# Patient Record
Sex: Male | Born: 1935 | Race: White | Hispanic: No | Marital: Married | Smoking: Never smoker
Health system: Southern US, Community
[De-identification: ages and names within clinical notes are randomized; demographics above are authoritative.]

## PROBLEM LIST (undated history)

## (undated) DIAGNOSIS — I10 Essential (primary) hypertension: Secondary | ICD-10-CM

## (undated) DIAGNOSIS — M810 Age-related osteoporosis without current pathological fracture: Secondary | ICD-10-CM

## (undated) DIAGNOSIS — E119 Type 2 diabetes mellitus without complications: Secondary | ICD-10-CM

## (undated) DIAGNOSIS — C61 Malignant neoplasm of prostate: Secondary | ICD-10-CM

## (undated) DIAGNOSIS — E785 Hyperlipidemia, unspecified: Secondary | ICD-10-CM

## (undated) DIAGNOSIS — C679 Malignant neoplasm of bladder, unspecified: Secondary | ICD-10-CM

## (undated) DIAGNOSIS — J189 Pneumonia, unspecified organism: Secondary | ICD-10-CM

## (undated) DIAGNOSIS — F0390 Unspecified dementia without behavioral disturbance: Secondary | ICD-10-CM

## (undated) HISTORY — DX: Essential (primary) hypertension: I10

## (undated) HISTORY — PX: ROTATOR CUFF REPAIR: SHX139

## (undated) HISTORY — DX: Type 2 diabetes mellitus without complications: E11.9

## (undated) HISTORY — PX: TONSILLECTOMY: SUR1361

## (undated) HISTORY — DX: Hyperlipidemia, unspecified: E78.5

## (undated) HISTORY — PX: PROSTATE SURGERY: SHX751

---

## 2003-08-10 ENCOUNTER — Ambulatory Visit (HOSPITAL_COMMUNITY): Admission: RE | Admit: 2003-08-10 | Discharge: 2003-08-10 | Payer: Self-pay | Admitting: Gastroenterology

## 2005-09-04 ENCOUNTER — Encounter (INDEPENDENT_AMBULATORY_CARE_PROVIDER_SITE_OTHER): Payer: Self-pay | Admitting: *Deleted

## 2005-09-04 ENCOUNTER — Inpatient Hospital Stay (HOSPITAL_COMMUNITY): Admission: RE | Admit: 2005-09-04 | Discharge: 2005-09-05 | Payer: Self-pay | Admitting: Urology

## 2005-09-06 ENCOUNTER — Emergency Department (HOSPITAL_COMMUNITY): Admission: EM | Admit: 2005-09-06 | Discharge: 2005-09-06 | Payer: Self-pay | Admitting: Emergency Medicine

## 2008-05-14 ENCOUNTER — Encounter: Admission: RE | Admit: 2008-05-14 | Discharge: 2008-05-14 | Payer: Self-pay | Admitting: Urology

## 2008-05-19 ENCOUNTER — Encounter (INDEPENDENT_AMBULATORY_CARE_PROVIDER_SITE_OTHER): Payer: Self-pay | Admitting: Urology

## 2008-05-19 ENCOUNTER — Ambulatory Visit (HOSPITAL_BASED_OUTPATIENT_CLINIC_OR_DEPARTMENT_OTHER): Admission: RE | Admit: 2008-05-19 | Discharge: 2008-05-19 | Payer: Self-pay | Admitting: Urology

## 2008-06-15 ENCOUNTER — Ambulatory Visit (HOSPITAL_COMMUNITY): Admission: RE | Admit: 2008-06-15 | Discharge: 2008-06-15 | Payer: Self-pay | Admitting: Urology

## 2008-06-15 ENCOUNTER — Encounter (INDEPENDENT_AMBULATORY_CARE_PROVIDER_SITE_OTHER): Payer: Self-pay | Admitting: Urology

## 2008-12-29 ENCOUNTER — Ambulatory Visit (HOSPITAL_COMMUNITY): Admission: RE | Admit: 2008-12-29 | Discharge: 2008-12-29 | Payer: Self-pay | Admitting: Gastroenterology

## 2010-05-28 ENCOUNTER — Encounter: Payer: Self-pay | Admitting: Urology

## 2010-08-13 LAB — GLUCOSE, CAPILLARY

## 2010-08-22 LAB — BASIC METABOLIC PANEL
CO2: 32 mEq/L (ref 19–32)
Calcium: 9.2 mg/dL (ref 8.4–10.5)
Chloride: 100 mEq/L (ref 96–112)
Creatinine, Ser: 1.02 mg/dL (ref 0.4–1.5)
GFR calc Af Amer: >60 mL/min (ref >60)
Glucose, Bld: 227 mg/dL — ABNORMAL HIGH (ref 70–99)

## 2010-08-22 LAB — GLUCOSE, CAPILLARY: Glucose-Capillary: 155 mg/dL — ABNORMAL HIGH (ref 70–99)

## 2010-08-22 LAB — POCT I-STAT 4, (NA,K, GLUC, HGB,HCT)
Glucose, Bld: 169 mg/dL — ABNORMAL HIGH (ref 70–99)
Hemoglobin: 14.3 g/dL (ref 13.0–17.0)
Potassium: 4.2 mEq/L (ref 3.5–5.1)

## 2010-08-23 LAB — BASIC METABOLIC PANEL
BUN: 18 mg/dL (ref 6–23)
Creatinine, Ser: 0.96 mg/dL (ref 0.4–1.5)
GFR calc Af Amer: >60 mL/min (ref >60)
GFR calc non Af Amer: >60 mL/min (ref >60)
Potassium: 5.6 mEq/L — ABNORMAL HIGH (ref 3.5–5.1)

## 2010-08-23 LAB — HEMOGLOBIN AND HEMATOCRIT, BLOOD
HCT: 37.5 % — ABNORMAL LOW (ref 39.0–52.0)
Hemoglobin: 12.4 g/dL — ABNORMAL LOW (ref 13.0–17.0)

## 2010-09-20 NOTE — Op Note (Signed)
NAME:  [name], [name]                ACCOUNT NO.:  [id number]   MEDICAL RECORD NO.:  [id number]          PATIENT TYPE:  AMB   LOCATION:  ENDO                         FACILITY:  [facility]   PHYSICIAN:  Petra Kuba, M.D.    DATE OF BIRTH:  1929/12/09   DATE OF PROCEDURE:  12/29/2008  DATE OF DISCHARGE:                               OPERATIVE REPORT   PROCEDURE:  Colonoscopy   INDICATIONS:  Screening.   Consent was signed after risks, benefits, methods and options thoroughly  discussed multiple times in the past.   MEDICINES USED:  Fentanyl 50 mcg, Versed 4 mg.   PROCEDURE:  Rectal inspection is pertinent for external hemorrhoids,  small.  Digital exam was negative.  The video pediatric colonoscope was  inserted and easily advanced around the colon to the cecum.  Other than  some left greater than rare right diverticula, no abnormalities were  seen.  To advance to the cecum did not require any abdominal pressure or  any position changes.  The cecum was identified by the appendiceal  orifice and ileocecal valve.  The scope was slowly withdrawn.  The prep  was adequate.  There was minimal liquid stool that required washing and  suctioning.  On slow withdrawal through the colon other than the  diverticula mentioned above, no abnormalities were seen, specifically no  polyps, tumors or masses.  Once back in the rectum anorectal pull-  through and retroflexion confirmed some small hemorrhoids.  The scope  was straightened and readvanced a short ways up the left side of the  colon.  Air was suctioned, scope removed.  The patient tolerated the  procedure well.  There was no obvious immediate complication.   ENDOSCOPIC DIAGNOSES:  1. Internal-external small hemorrhoids.  2. Left greater than rare right diverticula.  3. Otherwise within normal limits to the cecum.   PLAN:  Doubt he needs any further colonic screening, but happy to see  back p.r.n.  I will return the care to Dr. Clarene Duke for the  customary  healthcare maintenance.           ______________________________  Petra Kuba, M.D.     MEM/MEDQ  D:  12/29/2008  T:  12/29/2008  Job:  [id number]   cc:   Caryn Bee L. Little, M.D.  Fax: [phone]

## 2010-09-20 NOTE — Op Note (Signed)
NAME:  [name] NO.:  [id number]   MEDICAL RECORD NO.:  [id number]          PATIENT TYPE:  AMB   LOCATION:  NESC                         FACILITY:  [facility]   PHYSICIAN:  Heloise Purpura, MD      DATE OF BIRTH:  [birth year]   DATE OF PROCEDURE:  05/19/2008  DATE OF DISCHARGE:                               OPERATIVE REPORT   PREOPERATIVE DIAGNOSES:  1. Right ureteral calculus.  2. Right ureteral duplication.   POSTOPERATIVE DIAGNOSES:  1. Right ureteral calculus.  2. Right ureteral duplication.  3. Bladder tumor.   PROCEDURES:  1. Cystoscopy.  2. Saline bladder washing.  3. Bladder biopsy x2.  4. Right retrograde pyelography.  5. Right ureteroscopy with laser lithotripsy and stone removal.  6. Right ureteral stent placement.   SURGEON:  Dr. Heloise Purpura.   ANESTHESIA:  General.   COMPLICATIONS:  None.   INDICATIONS:  Mr. Lauder is a 77 year old gentleman with a history of  microscopic hematuria.  He underwent an evaluation in the summer of  2009, which demonstrated bilateral renal calculi.  He did develop acute  right-sided flank pain which subsequently resolved, although he did  develop some gross hematuria.  He underwent an ultrasound which  demonstrated right-sided hydronephrosis with bilateral renal calculi as  seen previously.  Further evaluation with a CT scan demonstrated that he  did have an obstructing 7 mm stone in the distal lower pole ureter on  the right side.  After discussion regarding management options for  treatment, he elected to proceed with ureteroscopic laser lithotripsy.  The potential risks, complications, and alternative treatment options  were discussed in detail.  Informed consent was obtained.   DESCRIPTION OF PROCEDURE:  The patient was taken to the operating room  and a general anesthetic was administered.  He was given preoperative  antibiotics, placed in the dorsal lithotomy position, and prepped and  draped in the  usual sterile fashion.  Next, a preoperative timeout was  performed.  Cystourethroscopy was then performed and the bladder was  systematically examined.  The urethra also appeared normal.  The  prostatic urethra was absent, consistent with the patient's prior  radical prostatectomy.  On inspection of the bladder, the patient was  noted to have a very small and low-grade appearing incidentally detected  papillary tumor just lateral to the right ureteral orifice.  There was  also an area of erythema toward the dome of the bladder.  A saline  bladder washing was obtained and both the patient's tumor and the  erythematous areas were biopsied.  These were then fulgurated with the  Bugbee electrode.  The patient's two right ureters were then  sequentially intubated with a 6-French ureteral catheter and retrograde  pyelography was performed.  The medial ureteral orifice drain in the  upper pole appeared to be normal without evidence of dilation or filling  defects.  The lower pole lateral ureteral orifice did demonstrate an  obvious filling defect in the distal ureter consistent with the  patient's stone.  A 0.038 Sensor guidewire was advanced by the wire up  into the lower pole renal pelvis.  The ureteral orifice was then balloon  dilated and a semi-rigid ureteroscopy was performed.  The stone was  visualized and fragmented with the holmium laser at a setting of 0.6  joules and 6 Hz.  The stone fragmented relatively well and was able to  be then extracted with the nitinol basket.  Once all stone fragments  were adequately removed, it was decided to place the ureteral stent.  Therefore, the wire was back loaded over the cystoscope and a 6 x 24  double-J ureteral stent was advanced over the wire using Seldinger  technique.  It was appropriately positioned under fluoroscopic and  cystoscopic guidance and a string tether was left in place.  The bladder  was then re-inspected and all stone fragments  were removed and sent for  stone analysis.  The patient's bladder was emptied and the procedure was  ended.  Both previously mentioned biopsy sites appeared to be without  evidence of bleeding.  The patient tolerated the procedure well without  complications.  He was able to be awakened and transferred to the  recovery unit in satisfactory condition.      Heloise Purpura, MD  Electronically Signed     LB/MEDQ  D:  05/19/2008  T:  05/19/2008  Job:  [id number]

## 2010-09-20 NOTE — Op Note (Signed)
NAME:  [name], BORMAN NO.:  [id number]   MEDICAL RECORD NO.:  [id number]          PATIENT TYPE:  AMB   LOCATION:  DAY                          FACILITY:  [facility]   PHYSICIAN:  Heloise Purpura, MD      DATE OF BIRTH:  02-13-1930   DATE OF PROCEDURE:  06/15/2008  DATE OF DISCHARGE:                               OPERATIVE REPORT   PREOPERATIVE DIAGNOSES:  1. Hematuria.  2. 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   POSTOPERATIVE DIAGNOSES:  1. Hematuria.  2. 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   PROCEDURE:  1. Cystoscopy.  2. Bladder biopsy.   SURGEON:  Heloise Purpura, M.D.   ANESTHESIA:  General.   COMPLICATIONS:  None.   ESTIMATED BLOOD LOSS:  Minimal.   INDICATIONS:  [name] is a 77 year old gentleman who recently  presented with gross hematuria and underwent an evaluation which  demonstrated a right ureteral calculus.  He underwent ureteroscopic  treatment of his calculus and was noted during that procedure to have a  small papillary bladder tumor as well as an erythematous lesion in the  bladder.  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  The biopsy from the erythematous  area within the bladder demonstrated a very focal area concerning for  possible carcinoma in situ.  After discussion with pathology, it was  elected to perform more biopsies to obtain more tissue in order to  determine whether he definitively had carcinoma in situ.  This was  discussed with the patient and the potential risks, complications, and  alternative options were discussed.  Informed consent was obtained.   DESCRIPTION OF PROCEDURE:  The patient was taken to the operating room  and a general anesthetic was administered.  He was given preoperative  antibiotics, placed in the dorsal lithotomy position, and prepped and  draped in the usual sterile fashion.  Next, a preoperative time-out was  performed.   Cystourethroscopy was then performed with a 12-degree and 70-  degree lens which demonstrated a normal urethra.  The prostate was  absent consistent with his history of a radical prostatectomy.  The  patient does have a history of duplicated ureters and these were noted  on cystoscopy in the expected positions bilaterally.  He did have a  healing site just lateral to the right ureteral orifices from his prior  resection of his papillary lesion.  There was also noted to be a healing  lesion toward the posterior dome of the bladder.  The bladder was  otherwise free of any lesions, tumors, or other mucosal pathology.  The  patient's healing previous biopsy site at the posterior dome was then  carefully examined.  Approximately four cold cup biopsies were taken of  the tissue immediately surrounding this previous biopsy site.  The Bugbee electrode was then used to perform fulguration of the site.  The bladder was emptied and reinspected and hemostasis appeared  excellent.  The procedure was, therefore, ended.  He tolerated the  procedure well without complications.  He was able  to be awakened and  transferred to recovery unit in satisfactory condition.      Heloise Purpura, MD  Electronically Signed     LB/MEDQ  D:  06/15/2008  T:  06/15/2008  Job:  [id number]

## 2010-09-23 NOTE — H&P (Signed)
NAME:  ROURKE, MCQUITTY NO.:  [id number]   MEDICAL RECORD NO.:  [id number]          PATIENT TYPE:  INP   LOCATION:  NA                           FACILITY:  [facility]   PHYSICIAN:  Heloise Purpura, MD      DATE OF BIRTH:  December 14, 1929   DATE OF ADMISSION:  09/04/2005  DATE OF DISCHARGE:                                HISTORY & PHYSICAL   CHIEF COMPLAINT:  Prostate cancer.   HISTORY:  Mr. Brucato is a 77 year old gentleman with recently-diagnosed  clinical stage T1C prostate cancer with a PSA of 4.48 and a Gleason score  3+3=6.  His prostate volume was measured at 40 cc.  After a discussion  regarding management options for clinically localized prostate cancer, the  patient elected to proceed with surgical therapy.  Specifically, due to the  patient's and low-risk disease, a detailed discussion was carried out  regarding active surveillance as a treatment option.  However, due to the  significant negative psychological effects of not actively treating his  malignancy, the patient was adamant about proceeding with therapy of  curative intent.   PAST MEDICAL HISTORY:  1.  Type 2 diabetes.  2.  Urolithiasis.  3.  Hypercholesterolemia.   PAST SURGICAL HISTORY:  Rotator cuff surgery.   MEDICATIONS:  1.  Metformin.  2.  Glipizide.  3.  Lipitor.  4.  Aspirin on hold.  5.  Multivitamin.   ALLERGIES:  No known drug allergies.   FAMILY HISTORY:  The patient's father lived to be 63.  There was a history  of hypertension and cerebral vascular accidents in the family.  There is no  history of prostate cancer.  The patient also has multiple other family  members have lived into their mid 17s.   SOCIAL HISTORY:  The patient is currently retired.  He is married.  He  denies tobacco or alcohol use.   PHYSICAL EXAMINATION:  CONSTITUTIONAL:  The patient is a well-nourished,  well-developed male in no acute distress.  CARDIOVASCULAR:  Regular rate and rhythm without obvious  murmurs.  LUNGS:  Clear bilaterally.  ABDOMEN:  Soft, nontender, nondistended without abdominal masses or bruits.  BACK:  No CVA tenderness.  DIGITAL RECTAL EXAM:  Prostate was approximately 50 g without nodularity or  induration.   IMPRESSION:  Clinically localized adenocarcinoma of prostate.   PLAN:  Mr. Johnsey will undergo a robotic-assisted laparoscopic radical  prostatectomy.  Postoperatively, he will be admitted to the hospital for  routine postoperative care.           ______________________________  Heloise Purpura, MD  Electronically Signed     LB/MEDQ  D:  09/04/2005  T:  09/04/2005  Job:  [id number]

## 2010-09-23 NOTE — Op Note (Signed)
NAME:  [name], BETHEL NO.:  [id number]   MEDICAL RECORD NO.:  [id number]          PATIENT TYPE:  INP   LOCATION:  1416                         [facility]ACILITY:  [facility]   PHYSICIAN:  Heloise Purpura, MD      DATE OF BIRTH:  [birth year]   DATE OF PROCEDURE:  09/04/2005  DATE OF DISCHARGE:                                 OPERATIVE REPORT   PREOPERATIVE DIAGNOSIS:  Clinically localized adenocarcinoma of prostate.   POSTOPERATIVE DIAGNOSIS:  Clinically localized adenocarcinoma of prostate.   PROCEDURE:  Robotic assisted laparoscopic radical prostatectomy (bilateral  nerve sparing).   SURGEON:  Dr. Heloise Purpura.   ASSISTANT:  Dr. Barron Alvine.   ANESTHESIA:  General.   COMPLICATIONS:  None.   ESTIMATED BLOOD LOSS:  50 mL.   INTRAVENOUS FLUIDS:  1200 mL of lactated Ringer's.   SPECIMEN:  Prostate and seminal vesicles.   DRAINS:  1.  A 20-French straight catheter.  2.  A #19 Blake pelvic drain.   INDICATIONS:  [name] is a 77 year old gentleman with clinical stage T1c  prostate cancer with a PSA of 4.48 and a Gleason score of 3+3= 6.  After  discussing management options for clinically localized prostate cancer, the  patient elected to proceed with surgical therapy.  Potential risks and  benefits of the above procedures were discussed with the patient and he  consented.   DESCRIPTION OF PROCEDURE:  The patient was taken to the operating room and a  general anesthetic was administered.  He was given preoperative antibiotics,  placed in the dorsal lithotomy position, prepped and draped in the usual  sterile fashion.  Next, a preoperative time-out was performed.  A Foley  catheter was then inserted to the bladder.  A site was then selected 18 cm  from the pubic symphysis and just to the left of the umbilicus for placement  of the camera port.  This was placed using a standard open Hasson technique.  This allowed entry into the peritoneal cavity under direct vision.   A  pneumoperitoneum was then established after a 12 mm port was placed.  Examination of the abdomen demonstrated no intraabdominal injuries or other  abnormalities.  The remaining ports were then placed.  Bilateral 8 mm  robotic ports were placed 16 cm from the pubic symphysis and 10 cm lateral  to the camera port.  An additional 8 mm robotic port was placed in the far  left lateral abdominal wall.  A 5 mm port was placed between the camera port  and the right robotic port.  An additional 12 mm port was placed in the far  right lateral abdominal wall for laparoscopic assistance.  All ports were  placed under direct vision without difficulty.  The surgical cart was then  docked.  With the aid of the hook cautery, the bladder was reflected  posteriorly allowing entry into the space of Retzius and identification of  the endopelvic fascia and prostate.  The endopelvic fascia was then incised  from the apex back to the base of the prostate allowing the underlying  levator  muscle fibers to be swept laterally off the prostate.  This isolated  the dorsal venous complex.  The dorsal venous complex was then ligated with  figure-of-eight 0 Vicryl suture.  Attention then turned to the bladder neck  which was identified with the help of Foley catheter manipulation.  The  bladder neck was then incised anteriorly, allowing exposure of the Foley  catheter.  The Foley catheter balloon was then deflated allowing the  catheter to be brought into the operative field and used to retract the  prostate anteriorly.  This exposed the posterior bladder neck which was  similarly divided.  Dissection continued posteriorly until the vasa  deferentia and seminal vesicles were identified.  The vasa deferentia were  identified, isolated and divided.  The seminal vesicles were then isolated  and divided after the seminal vesicle arteries were ligated with Hem-o-lok  clips.  This isolated the vascular pedicles of the  prostate.  Attention then  turned to the anterolateral portion of the prostate.  The lateral prostatic  fascia was incised allowing the neurovascular bundles to be separated from  the prostate.  Hem-o-lock clips were then used to ligate the vascular  pedicles of the prostate between the neurovascular bundle and the prostate.  The neurovascular bundles were then swept off the prostate out to the apex.  The urethra was then sharply divided along with the dorsal venous complex  which had been previously ligated.  This allowed the prostate be  disarticulated and placed up into the abdomen.  There were noted to be some  opened dorsal veins which were ligated with a second 0 Vicryl figure-of-  eight suture.  The pelvis was then copiously irrigated and hemostasis was  ensured.  With irrigation in the pelvis, air was injected into the rectal  catheter and there was no evidence of a rectal injury.  Attention then  turned to the urethral anastomosis.  A double-armed 3-0 Monocryl suture was  used to perform a 360 degree running tension-free anastomosis between the  bladder neck and urethra.  A new 20-French Coude catheter was then inserted  into the bladder and irrigated.  There were no blood clots within the  bladder and the catheter appeared to irrigate well.  The anastomosis  appeared to be watertight.  A #19 Blake drain was then brought through the  left robotic port and appropriately positioned in the pelvis.  It was  secured to the skin with a nylon suture.  The Endopouch retrieval bag was  then placed through the periumbilical port site to retrieve the prostate for  later removal from the body.  A 0 Vicryl stitch was placed through the right  side of the 12 mm port site fascia for port site closure.  All remaining  ports were then removed under direct vision.  The prostate specimen was then  removed intact within the Endopouch retrieval bag via the periumbilical port site.  This fascial opening  was then closed with a running 0 Vicryl suture.  Marcaine 0.25% was then injected into all incision sites which were  reapproximate at the skin level with Dermabond.  The patient appeared to  tolerate the procedure well without complications.  He was able to be  extubated and transferred to the recovery unit in satisfactory condition.  All sponge and needle counts were correct x2 at the end of the procedure.           ______________________________  Heloise Purpura, MD  Electronically Signed  LB/MEDQ  D:  09/04/2005  T:  09/04/2005  Job:  [id number]

## 2010-09-23 NOTE — Op Note (Signed)
NAME:  [name], DESHLER                          ACCOUNT NO.:  [id number]   MEDICAL RECORD NO.:  [id number]                   PATIENT TYPE:  AMB   LOCATION:  ENDO                                 FACILITY:  [facility]   PHYSICIAN:  Petra Kuba, M.D.                 DATE OF BIRTH:  15-Dec-1929   DATE OF PROCEDURE:  08/10/2003  DATE OF DISCHARGE:                                 OPERATIVE REPORT   PROCEDURE PERFORMED:  Colonoscopy.   ENDOSCOPIST:  Petra Kuba, M.D.   INDICATIONS FOR PROCEDURE:  Screening.  Consent was signed after the risks,  benefits, methods and options were thoroughly discussed in the office.   MEDICINES USED:  Demerol 50 mg, Versed 4 mg.   DESCRIPTION OF PROCEDURE:  Rectal inspection was pertinent for external  hemorrhoids, small.  Digital exam was negative.  A video pediatric  adjustable colonoscope was inserted and fairly easily advanced around the  colon to the cecum.  This did require rolling him on his back and some  abdominal pressure.  On insertion some left and right scattered diverticula  were seen but no other abnormality.  The cecum was identified by the  appendiceal orifice and the ileocecal valve.  The prep in the cecum and the  ascending was fair.  There was some stool adherent to the walls that could  not be washed off.  The rest of the prep was adequate.  On slow withdrawal  through the colon again, other than the left and right diverticula, no other  abnormalities were seen.  Specifically, no polyps, tumors, masses.  Anorectal pull-through and retroflexion confirmed small hemorrhoids.  Scope  was reinserted a short ways up the left side of the colon, air was  suctioned, scope removed.  The patient tolerated the procedure well.  There  was no immediate obvious complication except for intraprocedure bradycardia  which responded to suctioning air and withdrawing the loop.   ENDOSCOPIC DIAGNOSIS:  1. Internal and external hemorrhoids, small.  2. Left  and right scattered diverticula.  3. Otherwise within normal limits to the cecum.   PLAN:  Happy to see back p.r.n.  Get a repeat screening in five to 10 years.  Otherwise return care to  for the customary health care maintenance to  include yearly rectals and guaiacs.                                               Petra Kuba, M.D.   MEM/MEDQ  D:  08/10/2003  T:  08/10/2003  Job:  [id number]   cc:   Caryn Bee L. Little, M.D.  [address]  [place]  [place] [address]  Fax: [phone]

## 2010-09-23 NOTE — Discharge Summary (Signed)
NAMEJORGELUIS, [name] NO.:  [id number]   MEDICAL RECORD NO.:  [id number]          PATIENT TYPE:  INP   LOCATION:  1416                         FACILITY:  [facility]   PHYSICIAN:  Heloise Purpura, MD      DATE OF BIRTH:  1929-07-17   DATE OF ADMISSION:  09/04/2005  DATE OF DISCHARGE:  09/05/2005                                 DISCHARGE SUMMARY   ADMISSION DIAGNOSIS:  Clinically localized adenocarcinoma of prostate.   DISCHARGE DIAGNOSIS:  Clinically localized adenocarcinoma of prostate.   PROCEDURES:  Robotic-assisted laparoscopic radical prostatectomy.   HISTORY AND PHYSICAL:  For full details, please see admission history and  physical.  Briefly, Mr. [name] is a 77 year old gentleman with a recently  diagnosed clinical stage T1c prostate cancer with a PSA of 4.48 and a  Gleason score of 3 + 3 = 6.  After discussion regarding management options  for clinically localized prostate cancer, the patient elected to proceed  with surgical therapy.   HOSPITAL COURSE:  The patient was admitted the hospital, on September 04, 2005,  and underwent an uneventful robotic-assisted laparoscopic radical  prostatectomy.  Postoperatively, he was able to be transferred to a regular  hospital room following recovery from anesthesia.  Over the course of the  next 24 hours, he was able to begin ambulating which he did without  difficulty.  Furthermore, on postoperative day #1, he was able to begin a  clear liquid diet and then be transitioned to oral pain medication.  On the  morning of postoperative day #1, he did have some bloody drainage around his  [place]att site.  In addition, his hemoglobin had dropped to 10.3 from  12.9 immediately postoperatively.  Therefore, a hemoglobin was rechecked  approximately six hours later and it was stable at 10.2.  He had minimal  output from his pelvic drain and it was therefore removed.  The patient was  monitored throughout the remainder of the  afternoon on postoperative day #1  and continued to be stable without significant drainage from his [name] site.  He maintained excellent urine output and was subsequently able  to be discharged home later that afternoon.   DISPOSITION:  Home.   DISCHARGE MEDICATIONS:  Mr. [name] was instructed to resume his regular home  medications, excepting any aspirin or nonsteroidal anti-inflammatory drugs  or herbal supplements.  He was given Vicodin to take as needed for pain,  Colace as a stool softener, and Cipro to take beginning one day prior to his  return visit for Foley catheter removal.   DISCHARGE INSTRUCTIONS:  1.  Mr. [name] was encouraged to be ambulatory but specifically instructed to      refrain from any heavy lifting, strenuous activity, or driving.  2.  He was instructed to gradually advance his diet once passing flatus.  3.  In addition, he was told to call should he have signs or symptoms of      wound infection which were explained to him, fever, or difficulty with      his Foley catheter.  4.  He  was given instructions on Foley catheter care and given a leg bag for      daytime usage.   FINAL PATHOLOGY:  This demonstrated a Gleason 3 + 4 = 7, T2c NX MX  adenocarcinoma of the prostate indicating organ-confined disease.   FOLLOWUP:  Mr. [name] will return to see me approximately one week for Foley  catheter removal and to further discuss this pathology in detail.           ______________________________  Heloise Purpura, MD  Electronically Signed     LB/MEDQ  D:  09/06/2005  T:  09/06/2005  Job:  [id number]   cc:   Caryn Bee L. Little, M.D.  Fax: [phone]

## 2011-05-15 DIAGNOSIS — L570 Actinic keratosis: Secondary | ICD-10-CM | POA: Diagnosis not present

## 2011-05-15 DIAGNOSIS — L821 Other seborrheic keratosis: Secondary | ICD-10-CM | POA: Diagnosis not present

## 2011-05-15 DIAGNOSIS — C44211 Basal cell carcinoma of skin of unspecified ear and external auricular canal: Secondary | ICD-10-CM | POA: Diagnosis not present

## 2011-05-15 DIAGNOSIS — C44319 Basal cell carcinoma of skin of other parts of face: Secondary | ICD-10-CM | POA: Diagnosis not present

## 2011-05-15 DIAGNOSIS — Z85820 Personal history of malignant melanoma of skin: Secondary | ICD-10-CM | POA: Diagnosis not present

## 2011-06-09 DIAGNOSIS — R82998 Other abnormal findings in urine: Secondary | ICD-10-CM | POA: Diagnosis not present

## 2011-06-09 DIAGNOSIS — Z8546 Personal history of malignant neoplasm of prostate: Secondary | ICD-10-CM | POA: Diagnosis not present

## 2011-06-09 DIAGNOSIS — D090 Carcinoma in situ of bladder: Secondary | ICD-10-CM | POA: Diagnosis not present

## 2011-06-09 DIAGNOSIS — Z8551 Personal history of malignant neoplasm of bladder: Secondary | ICD-10-CM | POA: Diagnosis not present

## 2011-06-26 DIAGNOSIS — L570 Actinic keratosis: Secondary | ICD-10-CM | POA: Diagnosis not present

## 2011-06-26 DIAGNOSIS — Z85828 Personal history of other malignant neoplasm of skin: Secondary | ICD-10-CM | POA: Diagnosis not present

## 2011-07-26 DIAGNOSIS — Z111 Encounter for screening for respiratory tuberculosis: Secondary | ICD-10-CM | POA: Diagnosis not present

## 2011-08-01 DIAGNOSIS — L570 Actinic keratosis: Secondary | ICD-10-CM | POA: Diagnosis not present

## 2011-08-01 DIAGNOSIS — Z0000 Encounter for general adult medical examination without abnormal findings: Secondary | ICD-10-CM | POA: Diagnosis not present

## 2011-08-01 DIAGNOSIS — E119 Type 2 diabetes mellitus without complications: Secondary | ICD-10-CM | POA: Diagnosis not present

## 2011-08-01 DIAGNOSIS — Z23 Encounter for immunization: Secondary | ICD-10-CM | POA: Diagnosis not present

## 2011-08-01 DIAGNOSIS — Z8546 Personal history of malignant neoplasm of prostate: Secondary | ICD-10-CM | POA: Diagnosis not present

## 2011-08-01 DIAGNOSIS — E1142 Type 2 diabetes mellitus with diabetic polyneuropathy: Secondary | ICD-10-CM | POA: Diagnosis not present

## 2011-08-01 DIAGNOSIS — C679 Malignant neoplasm of bladder, unspecified: Secondary | ICD-10-CM | POA: Diagnosis not present

## 2011-08-01 DIAGNOSIS — E1149 Type 2 diabetes mellitus with other diabetic neurological complication: Secondary | ICD-10-CM | POA: Diagnosis not present

## 2011-08-01 DIAGNOSIS — Z79899 Other long term (current) drug therapy: Secondary | ICD-10-CM | POA: Diagnosis not present

## 2011-08-01 DIAGNOSIS — C61 Malignant neoplasm of prostate: Secondary | ICD-10-CM | POA: Diagnosis not present

## 2011-08-01 DIAGNOSIS — Z8551 Personal history of malignant neoplasm of bladder: Secondary | ICD-10-CM | POA: Diagnosis not present

## 2011-08-01 DIAGNOSIS — E7800 Pure hypercholesterolemia, unspecified: Secondary | ICD-10-CM | POA: Diagnosis not present

## 2011-08-15 DIAGNOSIS — Z13820 Encounter for screening for osteoporosis: Secondary | ICD-10-CM | POA: Diagnosis not present

## 2011-09-18 DIAGNOSIS — D090 Carcinoma in situ of bladder: Secondary | ICD-10-CM | POA: Diagnosis not present

## 2011-09-18 DIAGNOSIS — Z8551 Personal history of malignant neoplasm of bladder: Secondary | ICD-10-CM | POA: Diagnosis not present

## 2011-09-18 DIAGNOSIS — N200 Calculus of kidney: Secondary | ICD-10-CM | POA: Diagnosis not present

## 2011-09-18 DIAGNOSIS — Z8546 Personal history of malignant neoplasm of prostate: Secondary | ICD-10-CM | POA: Diagnosis not present

## 2011-09-20 DIAGNOSIS — D090 Carcinoma in situ of bladder: Secondary | ICD-10-CM | POA: Diagnosis not present

## 2011-09-20 DIAGNOSIS — C61 Malignant neoplasm of prostate: Secondary | ICD-10-CM | POA: Diagnosis not present

## 2011-11-02 DIAGNOSIS — E1149 Type 2 diabetes mellitus with other diabetic neurological complication: Secondary | ICD-10-CM | POA: Diagnosis not present

## 2011-11-02 DIAGNOSIS — R609 Edema, unspecified: Secondary | ICD-10-CM | POA: Diagnosis not present

## 2011-11-02 DIAGNOSIS — E1142 Type 2 diabetes mellitus with diabetic polyneuropathy: Secondary | ICD-10-CM | POA: Diagnosis not present

## 2011-11-02 DIAGNOSIS — R635 Abnormal weight gain: Secondary | ICD-10-CM | POA: Diagnosis not present

## 2011-11-13 DIAGNOSIS — L570 Actinic keratosis: Secondary | ICD-10-CM | POA: Diagnosis not present

## 2011-11-13 DIAGNOSIS — Z85820 Personal history of malignant melanoma of skin: Secondary | ICD-10-CM | POA: Diagnosis not present

## 2011-12-15 DIAGNOSIS — C61 Malignant neoplasm of prostate: Secondary | ICD-10-CM | POA: Diagnosis not present

## 2011-12-22 DIAGNOSIS — C61 Malignant neoplasm of prostate: Secondary | ICD-10-CM | POA: Diagnosis not present

## 2011-12-22 DIAGNOSIS — D090 Carcinoma in situ of bladder: Secondary | ICD-10-CM | POA: Diagnosis not present

## 2011-12-22 DIAGNOSIS — R82998 Other abnormal findings in urine: Secondary | ICD-10-CM | POA: Diagnosis not present

## 2012-01-30 DIAGNOSIS — E1149 Type 2 diabetes mellitus with other diabetic neurological complication: Secondary | ICD-10-CM | POA: Diagnosis not present

## 2012-01-30 DIAGNOSIS — R609 Edema, unspecified: Secondary | ICD-10-CM | POA: Diagnosis not present

## 2012-01-31 DIAGNOSIS — Z23 Encounter for immunization: Secondary | ICD-10-CM | POA: Diagnosis not present

## 2012-01-31 DIAGNOSIS — E1149 Type 2 diabetes mellitus with other diabetic neurological complication: Secondary | ICD-10-CM | POA: Diagnosis not present

## 2012-01-31 DIAGNOSIS — E1142 Type 2 diabetes mellitus with diabetic polyneuropathy: Secondary | ICD-10-CM | POA: Diagnosis not present

## 2012-01-31 DIAGNOSIS — R609 Edema, unspecified: Secondary | ICD-10-CM | POA: Diagnosis not present

## 2012-03-26 DIAGNOSIS — E11329 Type 2 diabetes mellitus with mild nonproliferative diabetic retinopathy without macular edema: Secondary | ICD-10-CM | POA: Diagnosis not present

## 2012-03-26 DIAGNOSIS — H35359 Cystoid macular degeneration, unspecified eye: Secondary | ICD-10-CM | POA: Diagnosis not present

## 2012-03-26 DIAGNOSIS — H35379 Puckering of macula, unspecified eye: Secondary | ICD-10-CM | POA: Diagnosis not present

## 2012-03-26 DIAGNOSIS — H2510 Age-related nuclear cataract, unspecified eye: Secondary | ICD-10-CM | POA: Diagnosis not present

## 2012-04-15 DIAGNOSIS — L570 Actinic keratosis: Secondary | ICD-10-CM | POA: Diagnosis not present

## 2012-04-15 DIAGNOSIS — L821 Other seborrheic keratosis: Secondary | ICD-10-CM | POA: Diagnosis not present

## 2012-04-15 DIAGNOSIS — D235 Other benign neoplasm of skin of trunk: Secondary | ICD-10-CM | POA: Diagnosis not present

## 2012-04-15 DIAGNOSIS — Z85820 Personal history of malignant melanoma of skin: Secondary | ICD-10-CM | POA: Diagnosis not present

## 2012-04-25 DIAGNOSIS — Z961 Presence of intraocular lens: Secondary | ICD-10-CM | POA: Diagnosis not present

## 2012-04-25 DIAGNOSIS — H2510 Age-related nuclear cataract, unspecified eye: Secondary | ICD-10-CM | POA: Diagnosis not present

## 2012-05-23 DIAGNOSIS — E1142 Type 2 diabetes mellitus with diabetic polyneuropathy: Secondary | ICD-10-CM | POA: Diagnosis not present

## 2012-05-23 DIAGNOSIS — R609 Edema, unspecified: Secondary | ICD-10-CM | POA: Diagnosis not present

## 2012-05-23 DIAGNOSIS — E1149 Type 2 diabetes mellitus with other diabetic neurological complication: Secondary | ICD-10-CM | POA: Diagnosis not present

## 2012-07-31 DIAGNOSIS — R21 Rash and other nonspecific skin eruption: Secondary | ICD-10-CM | POA: Diagnosis not present

## 2012-07-31 DIAGNOSIS — E1149 Type 2 diabetes mellitus with other diabetic neurological complication: Secondary | ICD-10-CM | POA: Diagnosis not present

## 2012-08-01 DIAGNOSIS — E1149 Type 2 diabetes mellitus with other diabetic neurological complication: Secondary | ICD-10-CM | POA: Diagnosis not present

## 2012-08-01 DIAGNOSIS — R609 Edema, unspecified: Secondary | ICD-10-CM | POA: Diagnosis not present

## 2012-08-01 DIAGNOSIS — E1142 Type 2 diabetes mellitus with diabetic polyneuropathy: Secondary | ICD-10-CM | POA: Diagnosis not present

## 2012-08-01 DIAGNOSIS — R21 Rash and other nonspecific skin eruption: Secondary | ICD-10-CM | POA: Diagnosis not present

## 2012-08-02 DIAGNOSIS — D090 Carcinoma in situ of bladder: Secondary | ICD-10-CM | POA: Diagnosis not present

## 2012-08-22 DIAGNOSIS — L509 Urticaria, unspecified: Secondary | ICD-10-CM | POA: Diagnosis not present

## 2012-09-05 DIAGNOSIS — J309 Allergic rhinitis, unspecified: Secondary | ICD-10-CM | POA: Diagnosis not present

## 2012-09-05 DIAGNOSIS — R21 Rash and other nonspecific skin eruption: Secondary | ICD-10-CM | POA: Diagnosis not present

## 2012-09-09 DIAGNOSIS — R21 Rash and other nonspecific skin eruption: Secondary | ICD-10-CM | POA: Diagnosis not present

## 2012-11-04 DIAGNOSIS — T887XXA Unspecified adverse effect of drug or medicament, initial encounter: Secondary | ICD-10-CM | POA: Diagnosis not present

## 2012-11-04 DIAGNOSIS — Z85820 Personal history of malignant melanoma of skin: Secondary | ICD-10-CM | POA: Diagnosis not present

## 2012-11-04 DIAGNOSIS — I872 Venous insufficiency (chronic) (peripheral): Secondary | ICD-10-CM | POA: Diagnosis not present

## 2012-11-04 DIAGNOSIS — L570 Actinic keratosis: Secondary | ICD-10-CM | POA: Diagnosis not present

## 2012-11-25 DIAGNOSIS — E1149 Type 2 diabetes mellitus with other diabetic neurological complication: Secondary | ICD-10-CM | POA: Diagnosis not present

## 2012-11-28 DIAGNOSIS — E1142 Type 2 diabetes mellitus with diabetic polyneuropathy: Secondary | ICD-10-CM | POA: Diagnosis not present

## 2012-11-28 DIAGNOSIS — E1149 Type 2 diabetes mellitus with other diabetic neurological complication: Secondary | ICD-10-CM | POA: Diagnosis not present

## 2012-11-28 DIAGNOSIS — R0989 Other specified symptoms and signs involving the circulatory and respiratory systems: Secondary | ICD-10-CM | POA: Diagnosis not present

## 2012-11-28 DIAGNOSIS — R609 Edema, unspecified: Secondary | ICD-10-CM | POA: Diagnosis not present

## 2012-11-28 DIAGNOSIS — B351 Tinea unguium: Secondary | ICD-10-CM | POA: Diagnosis not present

## 2012-12-12 DIAGNOSIS — B351 Tinea unguium: Secondary | ICD-10-CM | POA: Diagnosis not present

## 2012-12-12 DIAGNOSIS — M79609 Pain in unspecified limb: Secondary | ICD-10-CM | POA: Diagnosis not present

## 2012-12-19 DIAGNOSIS — R0989 Other specified symptoms and signs involving the circulatory and respiratory systems: Secondary | ICD-10-CM | POA: Diagnosis not present

## 2013-02-03 DIAGNOSIS — D235 Other benign neoplasm of skin of trunk: Secondary | ICD-10-CM | POA: Diagnosis not present

## 2013-02-03 DIAGNOSIS — L570 Actinic keratosis: Secondary | ICD-10-CM | POA: Diagnosis not present

## 2013-02-03 DIAGNOSIS — Z85820 Personal history of malignant melanoma of skin: Secondary | ICD-10-CM | POA: Diagnosis not present

## 2013-02-06 DIAGNOSIS — Z23 Encounter for immunization: Secondary | ICD-10-CM | POA: Diagnosis not present

## 2013-03-03 DIAGNOSIS — R609 Edema, unspecified: Secondary | ICD-10-CM | POA: Diagnosis not present

## 2013-03-03 DIAGNOSIS — E1142 Type 2 diabetes mellitus with diabetic polyneuropathy: Secondary | ICD-10-CM | POA: Diagnosis not present

## 2013-03-03 DIAGNOSIS — E1149 Type 2 diabetes mellitus with other diabetic neurological complication: Secondary | ICD-10-CM | POA: Diagnosis not present

## 2013-03-07 DIAGNOSIS — C61 Malignant neoplasm of prostate: Secondary | ICD-10-CM | POA: Diagnosis not present

## 2013-03-14 DIAGNOSIS — C61 Malignant neoplasm of prostate: Secondary | ICD-10-CM | POA: Diagnosis not present

## 2013-03-14 DIAGNOSIS — D090 Carcinoma in situ of bladder: Secondary | ICD-10-CM | POA: Diagnosis not present

## 2013-03-19 DIAGNOSIS — L570 Actinic keratosis: Secondary | ICD-10-CM | POA: Diagnosis not present

## 2013-05-29 DIAGNOSIS — Z961 Presence of intraocular lens: Secondary | ICD-10-CM | POA: Diagnosis not present

## 2013-05-29 DIAGNOSIS — E119 Type 2 diabetes mellitus without complications: Secondary | ICD-10-CM | POA: Diagnosis not present

## 2013-09-04 DIAGNOSIS — R3129 Other microscopic hematuria: Secondary | ICD-10-CM | POA: Diagnosis not present

## 2013-09-04 DIAGNOSIS — E1142 Type 2 diabetes mellitus with diabetic polyneuropathy: Secondary | ICD-10-CM | POA: Diagnosis not present

## 2013-09-04 DIAGNOSIS — R609 Edema, unspecified: Secondary | ICD-10-CM | POA: Diagnosis not present

## 2013-09-04 DIAGNOSIS — E1149 Type 2 diabetes mellitus with other diabetic neurological complication: Secondary | ICD-10-CM | POA: Diagnosis not present

## 2013-11-03 DIAGNOSIS — L570 Actinic keratosis: Secondary | ICD-10-CM | POA: Diagnosis not present

## 2014-02-05 ENCOUNTER — Ambulatory Visit (INDEPENDENT_AMBULATORY_CARE_PROVIDER_SITE_OTHER): Payer: Medicare Other | Admitting: Podiatrist

## 2014-02-05 ENCOUNTER — Encounter: Payer: Self-pay | Admitting: Podiatrist

## 2014-02-05 VITALS — BP 149/71 | HR 86 | Resp 12 | Ht 70.0 in | Wt 150.0 lb

## 2014-02-05 DIAGNOSIS — E1159 Type 2 diabetes mellitus with other circulatory complications: Secondary | ICD-10-CM | POA: Diagnosis not present

## 2014-02-05 DIAGNOSIS — I739 Peripheral vascular disease, unspecified: Secondary | ICD-10-CM

## 2014-02-05 DIAGNOSIS — M79676 Pain in unspecified toe(s): Secondary | ICD-10-CM | POA: Diagnosis not present

## 2014-02-05 DIAGNOSIS — E1151 Type 2 diabetes mellitus with diabetic peripheral angiopathy without gangrene: Secondary | ICD-10-CM

## 2014-02-05 DIAGNOSIS — B351 Tinea unguium: Secondary | ICD-10-CM

## 2014-02-05 NOTE — Progress Notes (Signed)
   Subjective:    Patient ID: [name], male    DOB: 1930/04/23, 80 y.o.   MRN: [id number]  HPI Comments: Pt presents for debridement of 10 toenails.     Review of Systems  HENT: Positive for hearing loss.   All other systems reviewed and are negative.      Objective:   Physical Exam GENERAL APPEARANCE: Alert, conversant. Appropriately groomed. No acute distress.  VASCULAR: Pedal pulses palpable at 0/4 DP and faint PT bilateral.  Capillary refill time is immediate to all digits,  Proximal to distal cooling it warm to warm.   NEUROLOGIC: sensation is intact epicritically and protectively to 5.07 monofilament at 5/5 sites bilateral.  Light touch is intact bilateral, vibratory sensation decreased left greter than right, achilles tendon reflex is intact bilateral.  MUSCULOSKELETAL: acceptable muscle strength, tone and stability bilateral.  Intrinsic muscluature intact bilateral.  Rectus appearance of foot and mild digital contractures noted bilateral.   DERMATOLOGIC: skin color, texture, and turger are within normal limits.  No preulcerative lesions are seen, no interdigital maceration noted.  No open lesions present.  Digital nails are elongated, thickened, discolored, dystrophic and clinically mycotic, especially the left first.  It is also incurvated and uncomfortable.      Assessment & Plan:  Diabetes with neuropathy/angiopathy, symptomatic mycotic toenails  Plan:  Recommended a baseline non invasive arterial study to assess blood flow to the legs.  He is not having any symptoms and therefore would like to think about getting the test or not.  He will call if he would like me to set up the test for him.  Also I debrided the toenails in both length and thickness.  Discussed topical antifungal therapies as well.  He will consider its use at the next visit.

## 2014-02-06 DIAGNOSIS — Z23 Encounter for immunization: Secondary | ICD-10-CM | POA: Diagnosis not present

## 2014-03-05 DIAGNOSIS — Z1389 Encounter for screening for other disorder: Secondary | ICD-10-CM | POA: Diagnosis not present

## 2014-03-05 DIAGNOSIS — E1142 Type 2 diabetes mellitus with diabetic polyneuropathy: Secondary | ICD-10-CM | POA: Diagnosis not present

## 2014-03-11 DIAGNOSIS — C61 Malignant neoplasm of prostate: Secondary | ICD-10-CM | POA: Diagnosis not present

## 2014-03-18 DIAGNOSIS — Z8551 Personal history of malignant neoplasm of bladder: Secondary | ICD-10-CM | POA: Diagnosis not present

## 2014-03-20 DIAGNOSIS — Z8551 Personal history of malignant neoplasm of bladder: Secondary | ICD-10-CM | POA: Diagnosis not present

## 2014-06-15 DIAGNOSIS — E119 Type 2 diabetes mellitus without complications: Secondary | ICD-10-CM | POA: Diagnosis not present

## 2014-06-15 DIAGNOSIS — Z794 Long term (current) use of insulin: Secondary | ICD-10-CM | POA: Diagnosis not present

## 2014-06-15 DIAGNOSIS — Z961 Presence of intraocular lens: Secondary | ICD-10-CM | POA: Diagnosis not present

## 2014-07-01 DIAGNOSIS — X32XXXD Exposure to sunlight, subsequent encounter: Secondary | ICD-10-CM | POA: Diagnosis not present

## 2014-07-01 DIAGNOSIS — I8310 Varicose veins of unspecified lower extremity with inflammation: Secondary | ICD-10-CM | POA: Diagnosis not present

## 2014-07-01 DIAGNOSIS — C44612 Basal cell carcinoma of skin of right upper limb, including shoulder: Secondary | ICD-10-CM | POA: Diagnosis not present

## 2014-07-01 DIAGNOSIS — Z08 Encounter for follow-up examination after completed treatment for malignant neoplasm: Secondary | ICD-10-CM | POA: Diagnosis not present

## 2014-07-01 DIAGNOSIS — Z85820 Personal history of malignant melanoma of skin: Secondary | ICD-10-CM | POA: Diagnosis not present

## 2014-07-01 DIAGNOSIS — L570 Actinic keratosis: Secondary | ICD-10-CM | POA: Diagnosis not present

## 2014-07-01 DIAGNOSIS — Z1283 Encounter for screening for malignant neoplasm of skin: Secondary | ICD-10-CM | POA: Diagnosis not present

## 2014-07-15 DIAGNOSIS — Z85828 Personal history of other malignant neoplasm of skin: Secondary | ICD-10-CM | POA: Diagnosis not present

## 2014-07-15 DIAGNOSIS — Z08 Encounter for follow-up examination after completed treatment for malignant neoplasm: Secondary | ICD-10-CM | POA: Diagnosis not present

## 2014-07-15 DIAGNOSIS — L570 Actinic keratosis: Secondary | ICD-10-CM | POA: Diagnosis not present

## 2014-08-19 ENCOUNTER — Ambulatory Visit (INDEPENDENT_AMBULATORY_CARE_PROVIDER_SITE_OTHER): Payer: Medicare Other | Admitting: Podiatry

## 2014-08-19 ENCOUNTER — Encounter: Payer: Self-pay | Admitting: Podiatry

## 2014-08-19 DIAGNOSIS — B351 Tinea unguium: Secondary | ICD-10-CM

## 2014-08-19 DIAGNOSIS — M79676 Pain in unspecified toe(s): Secondary | ICD-10-CM | POA: Diagnosis not present

## 2014-08-19 DIAGNOSIS — E1159 Type 2 diabetes mellitus with other circulatory complications: Secondary | ICD-10-CM | POA: Diagnosis not present

## 2014-08-19 DIAGNOSIS — I739 Peripheral vascular disease, unspecified: Secondary | ICD-10-CM

## 2014-08-19 DIAGNOSIS — E1151 Type 2 diabetes mellitus with diabetic peripheral angiopathy without gangrene: Secondary | ICD-10-CM

## 2014-08-19 NOTE — Patient Instructions (Signed)
Diabetes and Foot Care Diabetes may cause you to have problems because of poor blood supply (circulation) to your feet and legs. This may cause the skin on your feet to become thinner, break easier, and heal more slowly. Your skin may become dry, and the skin may peel and crack. You may also have nerve damage in your legs and feet causing decreased feeling in them. You may not notice minor injuries to your feet that could lead to infections or more serious problems. Taking care of your feet is one of the most important things you can do for yourself.  HOME CARE INSTRUCTIONS  Wear shoes at all times, even in the house. Do not go barefoot. Bare feet are easily injured.  Check your feet daily for blisters, cuts, and redness. If you cannot see the bottom of your feet, use a mirror or ask someone for help.  Wash your feet with warm water (do not use hot water) and mild soap. Then pat your feet and the areas between your toes until they are completely dry. Do not soak your feet as this can dry your skin.  Apply a moisturizing lotion or petroleum jelly (that does not contain alcohol and is unscented) to the skin on your feet and to dry, brittle toenails. Do not apply lotion between your toes.  Trim your toenails straight across. Do not dig under them or around the cuticle. File the edges of your nails with an emery board or nail file.  Do not cut corns or calluses or try to remove them with medicine.  Wear clean socks or stockings every day. Make sure they are not too tight. Do not wear knee-high stockings since they may decrease blood flow to your legs.  Wear shoes that fit properly and have enough cushioning. To break in new shoes, wear them for just a few hours a day. This prevents you from injuring your feet. Always look in your shoes before you put them on to be sure there are no objects inside.  Do not cross your legs. This may decrease the blood flow to your feet.  If you find a minor scrape,  cut, or break in the skin on your feet, keep it and the skin around it clean and dry. These areas may be cleansed with mild soap and water. Do not cleanse the area with peroxide, alcohol, or iodine.  When you remove an adhesive bandage, be sure not to damage the skin around it.  If you have a wound, look at it several times a day to make sure it is healing.  Do not use heating pads or hot water bottles. They may burn your skin. If you have lost feeling in your feet or legs, you may not know it is happening until it is too late.  Make sure your health care provider performs a complete foot exam at least annually or more often if you have foot problems. Report any cuts, sores, or bruises to your health care provider immediately. SEEK MEDICAL CARE IF:   You have an injury that is not healing.  You have cuts or breaks in the skin.  You have an ingrown nail.  You notice redness on your legs or feet.  You feel burning or tingling in your legs or feet.  You have pain or cramps in your legs and feet.  Your legs or feet are numb.  Your feet always feel cold. SEEK IMMEDIATE MEDICAL CARE IF:   There is increasing redness,   swelling, or pain in or around a wound.  There is a red line that goes up your leg.  Pus is coming from a wound.  You develop a fever or as directed by your health care provider.  You notice a bad smell coming from an ulcer or wound. Document Released: 04/21/2000 Document Revised: 12/25/2012 Document Reviewed: 10/01/2012 Exit[employer] Patient Information 2015 [employer]. This information is not intended to replace advice given to you by your health care provider. Make sure you discuss any questions you have with your health care provider.  

## 2014-08-20 NOTE — Progress Notes (Signed)
Patient ID: [name], male   DOB: 1929/06/17, 81 y.o.   MRN: [id number] Subjective: This patient presents today complaining of painful toenails and requests debridement His wife is present in the treatment room today  Objective: The toenails are elongated, hypertrophic, incurvated, discolored and tender to direct palpation 6-10  Assessment: Symptomatic onychomycoses 6-1 Diabetic with a history of neuropathy and angiopathy  Plan: Debridement toenails 10 without any bleeding  Reappoint 3 months

## 2014-09-03 DIAGNOSIS — Z794 Long term (current) use of insulin: Secondary | ICD-10-CM | POA: Diagnosis not present

## 2014-09-03 DIAGNOSIS — E119 Type 2 diabetes mellitus without complications: Secondary | ICD-10-CM | POA: Diagnosis not present

## 2014-09-10 ENCOUNTER — Ambulatory Visit
Admission: RE | Admit: 2014-09-10 | Discharge: 2014-09-10 | Disposition: A | Discharge status: Home / Self Care | Payer: Medicare Other | Source: Ambulatory Visit | Attending: Family Medicine | Admitting: Family Medicine

## 2014-09-10 ENCOUNTER — Other Ambulatory Visit: Payer: Self-pay | Admitting: Family Medicine

## 2014-09-10 DIAGNOSIS — M25551 Pain in right hip: Secondary | ICD-10-CM | POA: Diagnosis not present

## 2014-09-10 DIAGNOSIS — H6123 Impacted cerumen, bilateral: Secondary | ICD-10-CM | POA: Diagnosis not present

## 2014-09-10 DIAGNOSIS — M1611 Unilateral primary osteoarthritis, right hip: Secondary | ICD-10-CM | POA: Diagnosis not present

## 2014-09-30 DIAGNOSIS — M7061 Trochanteric bursitis, right hip: Secondary | ICD-10-CM | POA: Diagnosis not present

## 2014-10-12 DIAGNOSIS — R2689 Other abnormalities of gait and mobility: Secondary | ICD-10-CM | POA: Diagnosis not present

## 2014-10-12 DIAGNOSIS — M25551 Pain in right hip: Secondary | ICD-10-CM | POA: Diagnosis not present

## 2014-10-12 DIAGNOSIS — M7061 Trochanteric bursitis, right hip: Secondary | ICD-10-CM | POA: Diagnosis not present

## 2014-10-13 DIAGNOSIS — M25551 Pain in right hip: Secondary | ICD-10-CM | POA: Diagnosis not present

## 2014-10-13 DIAGNOSIS — R2689 Other abnormalities of gait and mobility: Secondary | ICD-10-CM | POA: Diagnosis not present

## 2014-10-13 DIAGNOSIS — M7061 Trochanteric bursitis, right hip: Secondary | ICD-10-CM | POA: Diagnosis not present

## 2014-10-15 DIAGNOSIS — M25551 Pain in right hip: Secondary | ICD-10-CM | POA: Diagnosis not present

## 2014-10-15 DIAGNOSIS — M7061 Trochanteric bursitis, right hip: Secondary | ICD-10-CM | POA: Diagnosis not present

## 2014-10-15 DIAGNOSIS — R2689 Other abnormalities of gait and mobility: Secondary | ICD-10-CM | POA: Diagnosis not present

## 2014-10-19 DIAGNOSIS — M7061 Trochanteric bursitis, right hip: Secondary | ICD-10-CM | POA: Diagnosis not present

## 2014-10-19 DIAGNOSIS — R2689 Other abnormalities of gait and mobility: Secondary | ICD-10-CM | POA: Diagnosis not present

## 2014-10-19 DIAGNOSIS — M25551 Pain in right hip: Secondary | ICD-10-CM | POA: Diagnosis not present

## 2014-10-21 DIAGNOSIS — M25551 Pain in right hip: Secondary | ICD-10-CM | POA: Diagnosis not present

## 2014-10-21 DIAGNOSIS — R2689 Other abnormalities of gait and mobility: Secondary | ICD-10-CM | POA: Diagnosis not present

## 2014-10-21 DIAGNOSIS — M7061 Trochanteric bursitis, right hip: Secondary | ICD-10-CM | POA: Diagnosis not present

## 2014-10-22 DIAGNOSIS — R2689 Other abnormalities of gait and mobility: Secondary | ICD-10-CM | POA: Diagnosis not present

## 2014-10-22 DIAGNOSIS — M7061 Trochanteric bursitis, right hip: Secondary | ICD-10-CM | POA: Diagnosis not present

## 2014-10-22 DIAGNOSIS — M25551 Pain in right hip: Secondary | ICD-10-CM | POA: Diagnosis not present

## 2014-10-26 DIAGNOSIS — R2689 Other abnormalities of gait and mobility: Secondary | ICD-10-CM | POA: Diagnosis not present

## 2014-10-26 DIAGNOSIS — M25551 Pain in right hip: Secondary | ICD-10-CM | POA: Diagnosis not present

## 2014-10-26 DIAGNOSIS — M7061 Trochanteric bursitis, right hip: Secondary | ICD-10-CM | POA: Diagnosis not present

## 2014-10-28 DIAGNOSIS — M25551 Pain in right hip: Secondary | ICD-10-CM | POA: Diagnosis not present

## 2014-10-28 DIAGNOSIS — R2689 Other abnormalities of gait and mobility: Secondary | ICD-10-CM | POA: Diagnosis not present

## 2014-10-28 DIAGNOSIS — M7061 Trochanteric bursitis, right hip: Secondary | ICD-10-CM | POA: Diagnosis not present

## 2014-10-30 DIAGNOSIS — M25551 Pain in right hip: Secondary | ICD-10-CM | POA: Diagnosis not present

## 2014-10-30 DIAGNOSIS — R2689 Other abnormalities of gait and mobility: Secondary | ICD-10-CM | POA: Diagnosis not present

## 2014-10-30 DIAGNOSIS — M7061 Trochanteric bursitis, right hip: Secondary | ICD-10-CM | POA: Diagnosis not present

## 2014-11-02 DIAGNOSIS — R2689 Other abnormalities of gait and mobility: Secondary | ICD-10-CM | POA: Diagnosis not present

## 2014-11-02 DIAGNOSIS — M7061 Trochanteric bursitis, right hip: Secondary | ICD-10-CM | POA: Diagnosis not present

## 2014-11-02 DIAGNOSIS — M25551 Pain in right hip: Secondary | ICD-10-CM | POA: Diagnosis not present

## 2014-11-03 DIAGNOSIS — M7061 Trochanteric bursitis, right hip: Secondary | ICD-10-CM | POA: Diagnosis not present

## 2014-11-03 DIAGNOSIS — R2689 Other abnormalities of gait and mobility: Secondary | ICD-10-CM | POA: Diagnosis not present

## 2014-11-03 DIAGNOSIS — M25551 Pain in right hip: Secondary | ICD-10-CM | POA: Diagnosis not present

## 2014-11-06 DIAGNOSIS — M7061 Trochanteric bursitis, right hip: Secondary | ICD-10-CM | POA: Diagnosis not present

## 2014-11-06 DIAGNOSIS — R2689 Other abnormalities of gait and mobility: Secondary | ICD-10-CM | POA: Diagnosis not present

## 2014-11-06 DIAGNOSIS — M25551 Pain in right hip: Secondary | ICD-10-CM | POA: Diagnosis not present

## 2014-11-10 DIAGNOSIS — R2689 Other abnormalities of gait and mobility: Secondary | ICD-10-CM | POA: Diagnosis not present

## 2014-11-10 DIAGNOSIS — M7061 Trochanteric bursitis, right hip: Secondary | ICD-10-CM | POA: Diagnosis not present

## 2014-11-10 DIAGNOSIS — M25551 Pain in right hip: Secondary | ICD-10-CM | POA: Diagnosis not present

## 2014-11-12 DIAGNOSIS — M7061 Trochanteric bursitis, right hip: Secondary | ICD-10-CM | POA: Diagnosis not present

## 2014-11-12 DIAGNOSIS — M25551 Pain in right hip: Secondary | ICD-10-CM | POA: Diagnosis not present

## 2014-11-12 DIAGNOSIS — R2689 Other abnormalities of gait and mobility: Secondary | ICD-10-CM | POA: Diagnosis not present

## 2014-11-13 DIAGNOSIS — M25551 Pain in right hip: Secondary | ICD-10-CM | POA: Diagnosis not present

## 2014-11-13 DIAGNOSIS — R2689 Other abnormalities of gait and mobility: Secondary | ICD-10-CM | POA: Diagnosis not present

## 2014-11-13 DIAGNOSIS — M7061 Trochanteric bursitis, right hip: Secondary | ICD-10-CM | POA: Diagnosis not present

## 2014-11-16 DIAGNOSIS — R2689 Other abnormalities of gait and mobility: Secondary | ICD-10-CM | POA: Diagnosis not present

## 2014-11-16 DIAGNOSIS — M7061 Trochanteric bursitis, right hip: Secondary | ICD-10-CM | POA: Diagnosis not present

## 2014-11-16 DIAGNOSIS — M25551 Pain in right hip: Secondary | ICD-10-CM | POA: Diagnosis not present

## 2014-11-17 DIAGNOSIS — M25551 Pain in right hip: Secondary | ICD-10-CM | POA: Diagnosis not present

## 2014-11-17 DIAGNOSIS — M7061 Trochanteric bursitis, right hip: Secondary | ICD-10-CM | POA: Diagnosis not present

## 2014-11-17 DIAGNOSIS — R2689 Other abnormalities of gait and mobility: Secondary | ICD-10-CM | POA: Diagnosis not present

## 2014-11-19 DIAGNOSIS — R2689 Other abnormalities of gait and mobility: Secondary | ICD-10-CM | POA: Diagnosis not present

## 2014-11-19 DIAGNOSIS — M25551 Pain in right hip: Secondary | ICD-10-CM | POA: Diagnosis not present

## 2014-11-19 DIAGNOSIS — M7061 Trochanteric bursitis, right hip: Secondary | ICD-10-CM | POA: Diagnosis not present

## 2014-11-23 DIAGNOSIS — M25551 Pain in right hip: Secondary | ICD-10-CM | POA: Diagnosis not present

## 2014-11-23 DIAGNOSIS — R2689 Other abnormalities of gait and mobility: Secondary | ICD-10-CM | POA: Diagnosis not present

## 2014-11-23 DIAGNOSIS — M7061 Trochanteric bursitis, right hip: Secondary | ICD-10-CM | POA: Diagnosis not present

## 2014-11-24 DIAGNOSIS — M25551 Pain in right hip: Secondary | ICD-10-CM | POA: Diagnosis not present

## 2014-11-24 DIAGNOSIS — R2689 Other abnormalities of gait and mobility: Secondary | ICD-10-CM | POA: Diagnosis not present

## 2014-11-24 DIAGNOSIS — M7061 Trochanteric bursitis, right hip: Secondary | ICD-10-CM | POA: Diagnosis not present

## 2014-11-26 DIAGNOSIS — R2689 Other abnormalities of gait and mobility: Secondary | ICD-10-CM | POA: Diagnosis not present

## 2014-11-26 DIAGNOSIS — M7061 Trochanteric bursitis, right hip: Secondary | ICD-10-CM | POA: Diagnosis not present

## 2014-11-26 DIAGNOSIS — M25551 Pain in right hip: Secondary | ICD-10-CM | POA: Diagnosis not present

## 2014-11-30 ENCOUNTER — Ambulatory Visit: Payer: Medicare Other | Admitting: Podiatry

## 2014-12-01 ENCOUNTER — Encounter: Payer: Self-pay | Admitting: Podiatry

## 2014-12-01 ENCOUNTER — Ambulatory Visit (INDEPENDENT_AMBULATORY_CARE_PROVIDER_SITE_OTHER): Payer: Medicare Other | Admitting: Podiatry

## 2014-12-01 DIAGNOSIS — E1159 Type 2 diabetes mellitus with other circulatory complications: Secondary | ICD-10-CM

## 2014-12-01 DIAGNOSIS — I739 Peripheral vascular disease, unspecified: Secondary | ICD-10-CM | POA: Diagnosis not present

## 2014-12-01 DIAGNOSIS — B351 Tinea unguium: Secondary | ICD-10-CM

## 2014-12-01 DIAGNOSIS — E1151 Type 2 diabetes mellitus with diabetic peripheral angiopathy without gangrene: Secondary | ICD-10-CM

## 2014-12-01 DIAGNOSIS — M79676 Pain in unspecified toe(s): Secondary | ICD-10-CM

## 2014-12-01 NOTE — Patient Instructions (Signed)
Diabetes and Foot Care Diabetes may cause you to have problems because of poor blood supply (circulation) to your feet and legs. This may cause the skin on your feet to become thinner, break easier, and heal more slowly. Your skin may become dry, and the skin may peel and crack. You may also have nerve damage in your legs and feet causing decreased feeling in them. You may not notice minor injuries to your feet that could lead to infections or more serious problems. Taking care of your feet is one of the most important things you can do for yourself.  HOME CARE INSTRUCTIONS  Wear shoes at all times, even in the house. Do not go barefoot. Bare feet are easily injured.  Check your feet daily for blisters, cuts, and redness. If you cannot see the bottom of your feet, use a mirror or ask someone for help.  Wash your feet with warm water (do not use hot water) and mild soap. Then pat your feet and the areas between your toes until they are completely dry. Do not soak your feet as this can dry your skin.  Apply a moisturizing lotion or petroleum jelly (that does not contain alcohol and is unscented) to the skin on your feet and to dry, brittle toenails. Do not apply lotion between your toes.  Trim your toenails straight across. Do not dig under them or around the cuticle. File the edges of your nails with an emery board or nail file.  Do not cut corns or calluses or try to remove them with medicine.  Wear clean socks or stockings every day. Make sure they are not too tight. Do not wear knee-high stockings since they may decrease blood flow to your legs.  Wear shoes that fit properly and have enough cushioning. To break in new shoes, wear them for just a few hours a day. This prevents you from injuring your feet. Always look in your shoes before you put them on to be sure there are no objects inside.  Do not cross your legs. This may decrease the blood flow to your feet.  If you find a minor scrape,  cut, or break in the skin on your feet, keep it and the skin around it clean and dry. These areas may be cleansed with mild soap and water. Do not cleanse the area with peroxide, alcohol, or iodine.  When you remove an adhesive bandage, be sure not to damage the skin around it.  If you have a wound, look at it several times a day to make sure it is healing.  Do not use heating pads or hot water bottles. They may burn your skin. If you have lost feeling in your feet or legs, you may not know it is happening until it is too late.  Make sure your health care provider performs a complete foot exam at least annually or more often if you have foot problems. Report any cuts, sores, or bruises to your health care provider immediately. SEEK MEDICAL CARE IF:   You have an injury that is not healing.  You have cuts or breaks in the skin.  You have an ingrown nail.  You notice redness on your legs or feet.  You feel burning or tingling in your legs or feet.  You have pain or cramps in your legs and feet.  Your legs or feet are numb.  Your feet always feel cold. SEEK IMMEDIATE MEDICAL CARE IF:   There is increasing redness,   swelling, or pain in or around a wound.  There is a red line that goes up your leg.  Pus is coming from a wound.  You develop a fever or as directed by your health care provider.  You notice a bad smell coming from an ulcer or wound. Document Released: 04/21/2000 Document Revised: 12/25/2012 Document Reviewed: 10/01/2012 Exit[employer] Patient Information 2015 [employer]. This information is not intended to replace advice given to you by your health care provider. Make sure you discuss any questions you have with your health care provider.  

## 2014-12-01 NOTE — Progress Notes (Signed)
   Subjective:    Patient ID: [name], male    DOB: 09/03/1929, 81 y.o.   MRN: [id number]  HPI  This patient presents today complaining of painful toenails and requests toenail debridement  Review of Systems  All other systems reviewed and are negative.      Objective:   Physical Exam  The toenails are hypertrophic, incurvated, elongated, discolored and tender direct palpation 6-10       Assessment & Plan:   Assessment: Symptomatic onychomycoses 6-10 Diabetic with a history of neuropathy and angiopathy  Plan: Debridement of toenails 10 without any bleeding  Reappoint 3 month

## 2014-12-21 DIAGNOSIS — L570 Actinic keratosis: Secondary | ICD-10-CM | POA: Diagnosis not present

## 2014-12-21 DIAGNOSIS — X32XXXD Exposure to sunlight, subsequent encounter: Secondary | ICD-10-CM | POA: Diagnosis not present

## 2014-12-21 DIAGNOSIS — L820 Inflamed seborrheic keratosis: Secondary | ICD-10-CM | POA: Diagnosis not present

## 2015-02-19 DIAGNOSIS — M7061 Trochanteric bursitis, right hip: Secondary | ICD-10-CM | POA: Diagnosis not present

## 2015-02-24 DIAGNOSIS — M7061 Trochanteric bursitis, right hip: Secondary | ICD-10-CM | POA: Diagnosis not present

## 2015-02-24 DIAGNOSIS — M25551 Pain in right hip: Secondary | ICD-10-CM | POA: Diagnosis not present

## 2015-02-24 DIAGNOSIS — R2689 Other abnormalities of gait and mobility: Secondary | ICD-10-CM | POA: Diagnosis not present

## 2015-02-25 DIAGNOSIS — R2689 Other abnormalities of gait and mobility: Secondary | ICD-10-CM | POA: Diagnosis not present

## 2015-02-25 DIAGNOSIS — M7061 Trochanteric bursitis, right hip: Secondary | ICD-10-CM | POA: Diagnosis not present

## 2015-02-25 DIAGNOSIS — M25551 Pain in right hip: Secondary | ICD-10-CM | POA: Diagnosis not present

## 2015-03-01 DIAGNOSIS — R2689 Other abnormalities of gait and mobility: Secondary | ICD-10-CM | POA: Diagnosis not present

## 2015-03-01 DIAGNOSIS — M25551 Pain in right hip: Secondary | ICD-10-CM | POA: Diagnosis not present

## 2015-03-01 DIAGNOSIS — M7061 Trochanteric bursitis, right hip: Secondary | ICD-10-CM | POA: Diagnosis not present

## 2015-03-03 ENCOUNTER — Ambulatory Visit: Payer: Medicare Other | Admitting: Podiatry

## 2015-03-03 DIAGNOSIS — R2689 Other abnormalities of gait and mobility: Secondary | ICD-10-CM | POA: Diagnosis not present

## 2015-03-03 DIAGNOSIS — M25551 Pain in right hip: Secondary | ICD-10-CM | POA: Diagnosis not present

## 2015-03-03 DIAGNOSIS — M7061 Trochanteric bursitis, right hip: Secondary | ICD-10-CM | POA: Diagnosis not present

## 2015-03-04 DIAGNOSIS — M7061 Trochanteric bursitis, right hip: Secondary | ICD-10-CM | POA: Diagnosis not present

## 2015-03-04 DIAGNOSIS — M25551 Pain in right hip: Secondary | ICD-10-CM | POA: Diagnosis not present

## 2015-03-04 DIAGNOSIS — R2689 Other abnormalities of gait and mobility: Secondary | ICD-10-CM | POA: Diagnosis not present

## 2015-03-08 DIAGNOSIS — M7061 Trochanteric bursitis, right hip: Secondary | ICD-10-CM | POA: Diagnosis not present

## 2015-03-08 DIAGNOSIS — R2689 Other abnormalities of gait and mobility: Secondary | ICD-10-CM | POA: Diagnosis not present

## 2015-03-08 DIAGNOSIS — M25551 Pain in right hip: Secondary | ICD-10-CM | POA: Diagnosis not present

## 2015-03-10 DIAGNOSIS — M25551 Pain in right hip: Secondary | ICD-10-CM | POA: Diagnosis not present

## 2015-03-10 DIAGNOSIS — E119 Type 2 diabetes mellitus without complications: Secondary | ICD-10-CM | POA: Diagnosis not present

## 2015-03-10 DIAGNOSIS — Z794 Long term (current) use of insulin: Secondary | ICD-10-CM | POA: Diagnosis not present

## 2015-03-10 DIAGNOSIS — R2689 Other abnormalities of gait and mobility: Secondary | ICD-10-CM | POA: Diagnosis not present

## 2015-03-10 DIAGNOSIS — M7061 Trochanteric bursitis, right hip: Secondary | ICD-10-CM | POA: Diagnosis not present

## 2015-03-11 DIAGNOSIS — M25551 Pain in right hip: Secondary | ICD-10-CM | POA: Diagnosis not present

## 2015-03-11 DIAGNOSIS — M7061 Trochanteric bursitis, right hip: Secondary | ICD-10-CM | POA: Diagnosis not present

## 2015-03-11 DIAGNOSIS — R2689 Other abnormalities of gait and mobility: Secondary | ICD-10-CM | POA: Diagnosis not present

## 2015-03-15 DIAGNOSIS — R2689 Other abnormalities of gait and mobility: Secondary | ICD-10-CM | POA: Diagnosis not present

## 2015-03-15 DIAGNOSIS — M7061 Trochanteric bursitis, right hip: Secondary | ICD-10-CM | POA: Diagnosis not present

## 2015-03-15 DIAGNOSIS — M25551 Pain in right hip: Secondary | ICD-10-CM | POA: Diagnosis not present

## 2015-03-17 DIAGNOSIS — R2689 Other abnormalities of gait and mobility: Secondary | ICD-10-CM | POA: Diagnosis not present

## 2015-03-17 DIAGNOSIS — M25551 Pain in right hip: Secondary | ICD-10-CM | POA: Diagnosis not present

## 2015-03-17 DIAGNOSIS — C61 Malignant neoplasm of prostate: Secondary | ICD-10-CM | POA: Diagnosis not present

## 2015-03-17 DIAGNOSIS — M7061 Trochanteric bursitis, right hip: Secondary | ICD-10-CM | POA: Diagnosis not present

## 2015-03-18 DIAGNOSIS — R2689 Other abnormalities of gait and mobility: Secondary | ICD-10-CM | POA: Diagnosis not present

## 2015-03-18 DIAGNOSIS — M25551 Pain in right hip: Secondary | ICD-10-CM | POA: Diagnosis not present

## 2015-03-18 DIAGNOSIS — M7061 Trochanteric bursitis, right hip: Secondary | ICD-10-CM | POA: Diagnosis not present

## 2015-03-22 DIAGNOSIS — M25551 Pain in right hip: Secondary | ICD-10-CM | POA: Diagnosis not present

## 2015-03-22 DIAGNOSIS — M7061 Trochanteric bursitis, right hip: Secondary | ICD-10-CM | POA: Diagnosis not present

## 2015-03-22 DIAGNOSIS — R2689 Other abnormalities of gait and mobility: Secondary | ICD-10-CM | POA: Diagnosis not present

## 2015-03-23 DIAGNOSIS — R2689 Other abnormalities of gait and mobility: Secondary | ICD-10-CM | POA: Diagnosis not present

## 2015-03-23 DIAGNOSIS — M25551 Pain in right hip: Secondary | ICD-10-CM | POA: Diagnosis not present

## 2015-03-23 DIAGNOSIS — M7061 Trochanteric bursitis, right hip: Secondary | ICD-10-CM | POA: Diagnosis not present

## 2015-03-24 DIAGNOSIS — D090 Carcinoma in situ of bladder: Secondary | ICD-10-CM | POA: Diagnosis not present

## 2015-03-24 DIAGNOSIS — C61 Malignant neoplasm of prostate: Secondary | ICD-10-CM | POA: Diagnosis not present

## 2015-03-25 DIAGNOSIS — R2689 Other abnormalities of gait and mobility: Secondary | ICD-10-CM | POA: Diagnosis not present

## 2015-03-25 DIAGNOSIS — M25551 Pain in right hip: Secondary | ICD-10-CM | POA: Diagnosis not present

## 2015-03-25 DIAGNOSIS — M7061 Trochanteric bursitis, right hip: Secondary | ICD-10-CM | POA: Diagnosis not present

## 2015-03-29 DIAGNOSIS — M7061 Trochanteric bursitis, right hip: Secondary | ICD-10-CM | POA: Diagnosis not present

## 2015-03-29 DIAGNOSIS — R2689 Other abnormalities of gait and mobility: Secondary | ICD-10-CM | POA: Diagnosis not present

## 2015-03-29 DIAGNOSIS — M25551 Pain in right hip: Secondary | ICD-10-CM | POA: Diagnosis not present

## 2015-03-30 DIAGNOSIS — M25551 Pain in right hip: Secondary | ICD-10-CM | POA: Diagnosis not present

## 2015-03-30 DIAGNOSIS — M7061 Trochanteric bursitis, right hip: Secondary | ICD-10-CM | POA: Diagnosis not present

## 2015-03-30 DIAGNOSIS — R2689 Other abnormalities of gait and mobility: Secondary | ICD-10-CM | POA: Diagnosis not present

## 2015-04-02 DIAGNOSIS — M25551 Pain in right hip: Secondary | ICD-10-CM | POA: Diagnosis not present

## 2015-04-02 DIAGNOSIS — R2689 Other abnormalities of gait and mobility: Secondary | ICD-10-CM | POA: Diagnosis not present

## 2015-04-02 DIAGNOSIS — M7061 Trochanteric bursitis, right hip: Secondary | ICD-10-CM | POA: Diagnosis not present

## 2015-04-05 DIAGNOSIS — M25551 Pain in right hip: Secondary | ICD-10-CM | POA: Diagnosis not present

## 2015-04-05 DIAGNOSIS — M7061 Trochanteric bursitis, right hip: Secondary | ICD-10-CM | POA: Diagnosis not present

## 2015-04-05 DIAGNOSIS — R2689 Other abnormalities of gait and mobility: Secondary | ICD-10-CM | POA: Diagnosis not present

## 2015-04-06 DIAGNOSIS — M7061 Trochanteric bursitis, right hip: Secondary | ICD-10-CM | POA: Diagnosis not present

## 2015-04-06 DIAGNOSIS — R2689 Other abnormalities of gait and mobility: Secondary | ICD-10-CM | POA: Diagnosis not present

## 2015-04-06 DIAGNOSIS — M25551 Pain in right hip: Secondary | ICD-10-CM | POA: Diagnosis not present

## 2015-04-07 DIAGNOSIS — X32XXXD Exposure to sunlight, subsequent encounter: Secondary | ICD-10-CM | POA: Diagnosis not present

## 2015-04-07 DIAGNOSIS — L570 Actinic keratosis: Secondary | ICD-10-CM | POA: Diagnosis not present

## 2015-04-08 DIAGNOSIS — M7061 Trochanteric bursitis, right hip: Secondary | ICD-10-CM | POA: Diagnosis not present

## 2015-04-08 DIAGNOSIS — R2689 Other abnormalities of gait and mobility: Secondary | ICD-10-CM | POA: Diagnosis not present

## 2015-04-08 DIAGNOSIS — M25551 Pain in right hip: Secondary | ICD-10-CM | POA: Diagnosis not present

## 2015-04-12 DIAGNOSIS — M25551 Pain in right hip: Secondary | ICD-10-CM | POA: Diagnosis not present

## 2015-04-12 DIAGNOSIS — R2689 Other abnormalities of gait and mobility: Secondary | ICD-10-CM | POA: Diagnosis not present

## 2015-04-12 DIAGNOSIS — M7061 Trochanteric bursitis, right hip: Secondary | ICD-10-CM | POA: Diagnosis not present

## 2015-04-13 DIAGNOSIS — M7061 Trochanteric bursitis, right hip: Secondary | ICD-10-CM | POA: Diagnosis not present

## 2015-04-15 DIAGNOSIS — R2689 Other abnormalities of gait and mobility: Secondary | ICD-10-CM | POA: Diagnosis not present

## 2015-04-15 DIAGNOSIS — M7061 Trochanteric bursitis, right hip: Secondary | ICD-10-CM | POA: Diagnosis not present

## 2015-04-15 DIAGNOSIS — M25551 Pain in right hip: Secondary | ICD-10-CM | POA: Diagnosis not present

## 2015-04-20 DIAGNOSIS — M7061 Trochanteric bursitis, right hip: Secondary | ICD-10-CM | POA: Diagnosis not present

## 2015-04-20 DIAGNOSIS — R2689 Other abnormalities of gait and mobility: Secondary | ICD-10-CM | POA: Diagnosis not present

## 2015-04-20 DIAGNOSIS — M25551 Pain in right hip: Secondary | ICD-10-CM | POA: Diagnosis not present

## 2015-04-22 DIAGNOSIS — R2689 Other abnormalities of gait and mobility: Secondary | ICD-10-CM | POA: Diagnosis not present

## 2015-04-22 DIAGNOSIS — M25551 Pain in right hip: Secondary | ICD-10-CM | POA: Diagnosis not present

## 2015-04-22 DIAGNOSIS — M7061 Trochanteric bursitis, right hip: Secondary | ICD-10-CM | POA: Diagnosis not present

## 2015-04-24 DIAGNOSIS — M7061 Trochanteric bursitis, right hip: Secondary | ICD-10-CM | POA: Diagnosis not present

## 2015-04-27 DIAGNOSIS — R2689 Other abnormalities of gait and mobility: Secondary | ICD-10-CM | POA: Diagnosis not present

## 2015-04-27 DIAGNOSIS — M7061 Trochanteric bursitis, right hip: Secondary | ICD-10-CM | POA: Diagnosis not present

## 2015-04-27 DIAGNOSIS — M25551 Pain in right hip: Secondary | ICD-10-CM | POA: Diagnosis not present

## 2015-04-28 DIAGNOSIS — M7061 Trochanteric bursitis, right hip: Secondary | ICD-10-CM | POA: Diagnosis not present

## 2015-07-21 DIAGNOSIS — Z23 Encounter for immunization: Secondary | ICD-10-CM | POA: Diagnosis not present

## 2015-07-21 DIAGNOSIS — H6120 Impacted cerumen, unspecified ear: Secondary | ICD-10-CM | POA: Diagnosis not present

## 2015-08-05 DIAGNOSIS — E119 Type 2 diabetes mellitus without complications: Secondary | ICD-10-CM | POA: Diagnosis not present

## 2015-08-05 DIAGNOSIS — Z961 Presence of intraocular lens: Secondary | ICD-10-CM | POA: Diagnosis not present

## 2015-08-10 DIAGNOSIS — M7061 Trochanteric bursitis, right hip: Secondary | ICD-10-CM | POA: Diagnosis not present

## 2015-08-16 DIAGNOSIS — M6281 Muscle weakness (generalized): Secondary | ICD-10-CM | POA: Diagnosis not present

## 2015-08-16 DIAGNOSIS — M25551 Pain in right hip: Secondary | ICD-10-CM | POA: Diagnosis not present

## 2015-08-16 DIAGNOSIS — R262 Difficulty in walking, not elsewhere classified: Secondary | ICD-10-CM | POA: Diagnosis not present

## 2015-08-16 DIAGNOSIS — M7061 Trochanteric bursitis, right hip: Secondary | ICD-10-CM | POA: Diagnosis not present

## 2015-08-18 DIAGNOSIS — R262 Difficulty in walking, not elsewhere classified: Secondary | ICD-10-CM | POA: Diagnosis not present

## 2015-08-18 DIAGNOSIS — M6281 Muscle weakness (generalized): Secondary | ICD-10-CM | POA: Diagnosis not present

## 2015-08-18 DIAGNOSIS — M25551 Pain in right hip: Secondary | ICD-10-CM | POA: Diagnosis not present

## 2015-08-18 DIAGNOSIS — M7061 Trochanteric bursitis, right hip: Secondary | ICD-10-CM | POA: Diagnosis not present

## 2015-08-19 DIAGNOSIS — M6281 Muscle weakness (generalized): Secondary | ICD-10-CM | POA: Diagnosis not present

## 2015-08-19 DIAGNOSIS — M7061 Trochanteric bursitis, right hip: Secondary | ICD-10-CM | POA: Diagnosis not present

## 2015-08-19 DIAGNOSIS — M25551 Pain in right hip: Secondary | ICD-10-CM | POA: Diagnosis not present

## 2015-08-19 DIAGNOSIS — R262 Difficulty in walking, not elsewhere classified: Secondary | ICD-10-CM | POA: Diagnosis not present

## 2015-08-23 DIAGNOSIS — M25551 Pain in right hip: Secondary | ICD-10-CM | POA: Diagnosis not present

## 2015-08-23 DIAGNOSIS — M6281 Muscle weakness (generalized): Secondary | ICD-10-CM | POA: Diagnosis not present

## 2015-08-23 DIAGNOSIS — M7061 Trochanteric bursitis, right hip: Secondary | ICD-10-CM | POA: Diagnosis not present

## 2015-08-23 DIAGNOSIS — R262 Difficulty in walking, not elsewhere classified: Secondary | ICD-10-CM | POA: Diagnosis not present

## 2015-08-25 DIAGNOSIS — M7061 Trochanteric bursitis, right hip: Secondary | ICD-10-CM | POA: Diagnosis not present

## 2015-08-25 DIAGNOSIS — M6281 Muscle weakness (generalized): Secondary | ICD-10-CM | POA: Diagnosis not present

## 2015-08-25 DIAGNOSIS — R262 Difficulty in walking, not elsewhere classified: Secondary | ICD-10-CM | POA: Diagnosis not present

## 2015-08-25 DIAGNOSIS — M25551 Pain in right hip: Secondary | ICD-10-CM | POA: Diagnosis not present

## 2015-08-26 DIAGNOSIS — R262 Difficulty in walking, not elsewhere classified: Secondary | ICD-10-CM | POA: Diagnosis not present

## 2015-08-26 DIAGNOSIS — M7061 Trochanteric bursitis, right hip: Secondary | ICD-10-CM | POA: Diagnosis not present

## 2015-08-26 DIAGNOSIS — M6281 Muscle weakness (generalized): Secondary | ICD-10-CM | POA: Diagnosis not present

## 2015-08-26 DIAGNOSIS — M25551 Pain in right hip: Secondary | ICD-10-CM | POA: Diagnosis not present

## 2015-08-30 DIAGNOSIS — M25551 Pain in right hip: Secondary | ICD-10-CM | POA: Diagnosis not present

## 2015-08-30 DIAGNOSIS — M7061 Trochanteric bursitis, right hip: Secondary | ICD-10-CM | POA: Diagnosis not present

## 2015-08-30 DIAGNOSIS — R262 Difficulty in walking, not elsewhere classified: Secondary | ICD-10-CM | POA: Diagnosis not present

## 2015-08-30 DIAGNOSIS — M6281 Muscle weakness (generalized): Secondary | ICD-10-CM | POA: Diagnosis not present

## 2015-09-01 DIAGNOSIS — M6281 Muscle weakness (generalized): Secondary | ICD-10-CM | POA: Diagnosis not present

## 2015-09-01 DIAGNOSIS — M25551 Pain in right hip: Secondary | ICD-10-CM | POA: Diagnosis not present

## 2015-09-01 DIAGNOSIS — M7061 Trochanteric bursitis, right hip: Secondary | ICD-10-CM | POA: Diagnosis not present

## 2015-09-01 DIAGNOSIS — R262 Difficulty in walking, not elsewhere classified: Secondary | ICD-10-CM | POA: Diagnosis not present

## 2015-09-03 DIAGNOSIS — R262 Difficulty in walking, not elsewhere classified: Secondary | ICD-10-CM | POA: Diagnosis not present

## 2015-09-03 DIAGNOSIS — M7061 Trochanteric bursitis, right hip: Secondary | ICD-10-CM | POA: Diagnosis not present

## 2015-09-03 DIAGNOSIS — M6281 Muscle weakness (generalized): Secondary | ICD-10-CM | POA: Diagnosis not present

## 2015-09-03 DIAGNOSIS — M25551 Pain in right hip: Secondary | ICD-10-CM | POA: Diagnosis not present

## 2015-09-06 DIAGNOSIS — R262 Difficulty in walking, not elsewhere classified: Secondary | ICD-10-CM | POA: Diagnosis not present

## 2015-09-06 DIAGNOSIS — M6281 Muscle weakness (generalized): Secondary | ICD-10-CM | POA: Diagnosis not present

## 2015-09-06 DIAGNOSIS — M7061 Trochanteric bursitis, right hip: Secondary | ICD-10-CM | POA: Diagnosis not present

## 2015-09-06 DIAGNOSIS — M25551 Pain in right hip: Secondary | ICD-10-CM | POA: Diagnosis not present

## 2015-09-08 DIAGNOSIS — M25551 Pain in right hip: Secondary | ICD-10-CM | POA: Diagnosis not present

## 2015-09-08 DIAGNOSIS — M6281 Muscle weakness (generalized): Secondary | ICD-10-CM | POA: Diagnosis not present

## 2015-09-08 DIAGNOSIS — R262 Difficulty in walking, not elsewhere classified: Secondary | ICD-10-CM | POA: Diagnosis not present

## 2015-09-08 DIAGNOSIS — M7061 Trochanteric bursitis, right hip: Secondary | ICD-10-CM | POA: Diagnosis not present

## 2015-09-09 DIAGNOSIS — Z8546 Personal history of malignant neoplasm of prostate: Secondary | ICD-10-CM | POA: Diagnosis not present

## 2015-09-09 DIAGNOSIS — M85859 Other specified disorders of bone density and structure, unspecified thigh: Secondary | ICD-10-CM | POA: Diagnosis not present

## 2015-09-09 DIAGNOSIS — Z8551 Personal history of malignant neoplasm of bladder: Secondary | ICD-10-CM | POA: Diagnosis not present

## 2015-09-09 DIAGNOSIS — M899 Disorder of bone, unspecified: Secondary | ICD-10-CM | POA: Diagnosis not present

## 2015-09-09 DIAGNOSIS — Z79899 Other long term (current) drug therapy: Secondary | ICD-10-CM | POA: Diagnosis not present

## 2015-09-09 DIAGNOSIS — E1142 Type 2 diabetes mellitus with diabetic polyneuropathy: Secondary | ICD-10-CM | POA: Diagnosis not present

## 2015-09-09 DIAGNOSIS — E7800 Pure hypercholesterolemia, unspecified: Secondary | ICD-10-CM | POA: Diagnosis not present

## 2015-09-09 DIAGNOSIS — Z0000 Encounter for general adult medical examination without abnormal findings: Secondary | ICD-10-CM | POA: Diagnosis not present

## 2015-09-10 DIAGNOSIS — R262 Difficulty in walking, not elsewhere classified: Secondary | ICD-10-CM | POA: Diagnosis not present

## 2015-09-10 DIAGNOSIS — M25551 Pain in right hip: Secondary | ICD-10-CM | POA: Diagnosis not present

## 2015-09-10 DIAGNOSIS — M7061 Trochanteric bursitis, right hip: Secondary | ICD-10-CM | POA: Diagnosis not present

## 2015-09-10 DIAGNOSIS — M6281 Muscle weakness (generalized): Secondary | ICD-10-CM | POA: Diagnosis not present

## 2015-09-13 DIAGNOSIS — R262 Difficulty in walking, not elsewhere classified: Secondary | ICD-10-CM | POA: Diagnosis not present

## 2015-09-13 DIAGNOSIS — M7061 Trochanteric bursitis, right hip: Secondary | ICD-10-CM | POA: Diagnosis not present

## 2015-09-13 DIAGNOSIS — M25551 Pain in right hip: Secondary | ICD-10-CM | POA: Diagnosis not present

## 2015-09-13 DIAGNOSIS — M6281 Muscle weakness (generalized): Secondary | ICD-10-CM | POA: Diagnosis not present

## 2015-09-15 DIAGNOSIS — R262 Difficulty in walking, not elsewhere classified: Secondary | ICD-10-CM | POA: Diagnosis not present

## 2015-09-15 DIAGNOSIS — M6281 Muscle weakness (generalized): Secondary | ICD-10-CM | POA: Diagnosis not present

## 2015-09-15 DIAGNOSIS — M7061 Trochanteric bursitis, right hip: Secondary | ICD-10-CM | POA: Diagnosis not present

## 2015-09-15 DIAGNOSIS — M25551 Pain in right hip: Secondary | ICD-10-CM | POA: Diagnosis not present

## 2015-09-16 DIAGNOSIS — M6281 Muscle weakness (generalized): Secondary | ICD-10-CM | POA: Diagnosis not present

## 2015-09-16 DIAGNOSIS — R262 Difficulty in walking, not elsewhere classified: Secondary | ICD-10-CM | POA: Diagnosis not present

## 2015-09-16 DIAGNOSIS — M7061 Trochanteric bursitis, right hip: Secondary | ICD-10-CM | POA: Diagnosis not present

## 2015-09-16 DIAGNOSIS — M25551 Pain in right hip: Secondary | ICD-10-CM | POA: Diagnosis not present

## 2015-09-22 DIAGNOSIS — M6281 Muscle weakness (generalized): Secondary | ICD-10-CM | POA: Diagnosis not present

## 2015-09-22 DIAGNOSIS — R262 Difficulty in walking, not elsewhere classified: Secondary | ICD-10-CM | POA: Diagnosis not present

## 2015-09-22 DIAGNOSIS — M25551 Pain in right hip: Secondary | ICD-10-CM | POA: Diagnosis not present

## 2015-09-22 DIAGNOSIS — M7061 Trochanteric bursitis, right hip: Secondary | ICD-10-CM | POA: Diagnosis not present

## 2015-09-27 DIAGNOSIS — M6281 Muscle weakness (generalized): Secondary | ICD-10-CM | POA: Diagnosis not present

## 2015-09-27 DIAGNOSIS — M7061 Trochanteric bursitis, right hip: Secondary | ICD-10-CM | POA: Diagnosis not present

## 2015-09-27 DIAGNOSIS — M25551 Pain in right hip: Secondary | ICD-10-CM | POA: Diagnosis not present

## 2015-09-27 DIAGNOSIS — R262 Difficulty in walking, not elsewhere classified: Secondary | ICD-10-CM | POA: Diagnosis not present

## 2015-09-29 DIAGNOSIS — M7061 Trochanteric bursitis, right hip: Secondary | ICD-10-CM | POA: Diagnosis not present

## 2015-09-30 DIAGNOSIS — R262 Difficulty in walking, not elsewhere classified: Secondary | ICD-10-CM | POA: Diagnosis not present

## 2015-09-30 DIAGNOSIS — M25551 Pain in right hip: Secondary | ICD-10-CM | POA: Diagnosis not present

## 2015-09-30 DIAGNOSIS — M7061 Trochanteric bursitis, right hip: Secondary | ICD-10-CM | POA: Diagnosis not present

## 2015-09-30 DIAGNOSIS — M6281 Muscle weakness (generalized): Secondary | ICD-10-CM | POA: Diagnosis not present

## 2015-10-05 DIAGNOSIS — M7061 Trochanteric bursitis, right hip: Secondary | ICD-10-CM | POA: Diagnosis not present

## 2015-10-05 DIAGNOSIS — M6281 Muscle weakness (generalized): Secondary | ICD-10-CM | POA: Diagnosis not present

## 2015-10-05 DIAGNOSIS — R262 Difficulty in walking, not elsewhere classified: Secondary | ICD-10-CM | POA: Diagnosis not present

## 2015-10-05 DIAGNOSIS — M25551 Pain in right hip: Secondary | ICD-10-CM | POA: Diagnosis not present

## 2015-10-07 DIAGNOSIS — M7061 Trochanteric bursitis, right hip: Secondary | ICD-10-CM | POA: Diagnosis not present

## 2015-10-07 DIAGNOSIS — M25551 Pain in right hip: Secondary | ICD-10-CM | POA: Diagnosis not present

## 2015-10-07 DIAGNOSIS — R262 Difficulty in walking, not elsewhere classified: Secondary | ICD-10-CM | POA: Diagnosis not present

## 2015-10-07 DIAGNOSIS — M6281 Muscle weakness (generalized): Secondary | ICD-10-CM | POA: Diagnosis not present

## 2015-10-12 DIAGNOSIS — M6281 Muscle weakness (generalized): Secondary | ICD-10-CM | POA: Diagnosis not present

## 2015-10-12 DIAGNOSIS — M25551 Pain in right hip: Secondary | ICD-10-CM | POA: Diagnosis not present

## 2015-10-12 DIAGNOSIS — M7061 Trochanteric bursitis, right hip: Secondary | ICD-10-CM | POA: Diagnosis not present

## 2015-10-12 DIAGNOSIS — R262 Difficulty in walking, not elsewhere classified: Secondary | ICD-10-CM | POA: Diagnosis not present

## 2015-10-14 DIAGNOSIS — M7061 Trochanteric bursitis, right hip: Secondary | ICD-10-CM | POA: Diagnosis not present

## 2015-10-14 DIAGNOSIS — M25551 Pain in right hip: Secondary | ICD-10-CM | POA: Diagnosis not present

## 2015-10-14 DIAGNOSIS — R262 Difficulty in walking, not elsewhere classified: Secondary | ICD-10-CM | POA: Diagnosis not present

## 2015-10-14 DIAGNOSIS — M6281 Muscle weakness (generalized): Secondary | ICD-10-CM | POA: Diagnosis not present

## 2015-10-19 DIAGNOSIS — M7061 Trochanteric bursitis, right hip: Secondary | ICD-10-CM | POA: Diagnosis not present

## 2015-10-19 DIAGNOSIS — R262 Difficulty in walking, not elsewhere classified: Secondary | ICD-10-CM | POA: Diagnosis not present

## 2015-10-19 DIAGNOSIS — M6281 Muscle weakness (generalized): Secondary | ICD-10-CM | POA: Diagnosis not present

## 2015-10-19 DIAGNOSIS — M25551 Pain in right hip: Secondary | ICD-10-CM | POA: Diagnosis not present

## 2015-10-22 DIAGNOSIS — R634 Abnormal weight loss: Secondary | ICD-10-CM | POA: Diagnosis not present

## 2015-10-22 DIAGNOSIS — F0390 Unspecified dementia without behavioral disturbance: Secondary | ICD-10-CM | POA: Diagnosis not present

## 2015-10-22 DIAGNOSIS — M899 Disorder of bone, unspecified: Secondary | ICD-10-CM | POA: Diagnosis not present

## 2015-10-22 DIAGNOSIS — Z794 Long term (current) use of insulin: Secondary | ICD-10-CM | POA: Diagnosis not present

## 2015-10-22 DIAGNOSIS — Z79899 Other long term (current) drug therapy: Secondary | ICD-10-CM | POA: Diagnosis not present

## 2015-10-22 DIAGNOSIS — E7800 Pure hypercholesterolemia, unspecified: Secondary | ICD-10-CM | POA: Diagnosis not present

## 2015-10-22 DIAGNOSIS — E1142 Type 2 diabetes mellitus with diabetic polyneuropathy: Secondary | ICD-10-CM | POA: Diagnosis not present

## 2015-10-22 DIAGNOSIS — R32 Unspecified urinary incontinence: Secondary | ICD-10-CM | POA: Diagnosis not present

## 2015-10-22 DIAGNOSIS — R197 Diarrhea, unspecified: Secondary | ICD-10-CM | POA: Diagnosis not present

## 2015-10-22 DIAGNOSIS — E119 Type 2 diabetes mellitus without complications: Secondary | ICD-10-CM | POA: Diagnosis not present

## 2015-11-02 DIAGNOSIS — N183 Chronic kidney disease, stage 3 (moderate): Secondary | ICD-10-CM | POA: Diagnosis not present

## 2015-11-02 DIAGNOSIS — N289 Disorder of kidney and ureter, unspecified: Secondary | ICD-10-CM | POA: Diagnosis not present

## 2015-11-03 DIAGNOSIS — X32XXXD Exposure to sunlight, subsequent encounter: Secondary | ICD-10-CM | POA: Diagnosis not present

## 2015-11-03 DIAGNOSIS — D485 Neoplasm of uncertain behavior of skin: Secondary | ICD-10-CM | POA: Diagnosis not present

## 2015-11-03 DIAGNOSIS — Z08 Encounter for follow-up examination after completed treatment for malignant neoplasm: Secondary | ICD-10-CM | POA: Diagnosis not present

## 2015-11-03 DIAGNOSIS — Z85828 Personal history of other malignant neoplasm of skin: Secondary | ICD-10-CM | POA: Diagnosis not present

## 2015-11-03 DIAGNOSIS — L570 Actinic keratosis: Secondary | ICD-10-CM | POA: Diagnosis not present

## 2015-11-03 DIAGNOSIS — Z1283 Encounter for screening for malignant neoplasm of skin: Secondary | ICD-10-CM | POA: Diagnosis not present

## 2015-11-15 DIAGNOSIS — F0280 Dementia in other diseases classified elsewhere without behavioral disturbance: Secondary | ICD-10-CM | POA: Diagnosis not present

## 2015-11-15 DIAGNOSIS — R262 Difficulty in walking, not elsewhere classified: Secondary | ICD-10-CM | POA: Diagnosis not present

## 2015-11-15 DIAGNOSIS — M7061 Trochanteric bursitis, right hip: Secondary | ICD-10-CM | POA: Diagnosis not present

## 2015-11-15 DIAGNOSIS — M25551 Pain in right hip: Secondary | ICD-10-CM | POA: Diagnosis not present

## 2015-11-17 DIAGNOSIS — R262 Difficulty in walking, not elsewhere classified: Secondary | ICD-10-CM | POA: Diagnosis not present

## 2015-11-17 DIAGNOSIS — M25551 Pain in right hip: Secondary | ICD-10-CM | POA: Diagnosis not present

## 2015-11-17 DIAGNOSIS — F0280 Dementia in other diseases classified elsewhere without behavioral disturbance: Secondary | ICD-10-CM | POA: Diagnosis not present

## 2015-11-17 DIAGNOSIS — M7061 Trochanteric bursitis, right hip: Secondary | ICD-10-CM | POA: Diagnosis not present

## 2015-11-19 DIAGNOSIS — R262 Difficulty in walking, not elsewhere classified: Secondary | ICD-10-CM | POA: Diagnosis not present

## 2015-11-19 DIAGNOSIS — M7061 Trochanteric bursitis, right hip: Secondary | ICD-10-CM | POA: Diagnosis not present

## 2015-11-19 DIAGNOSIS — M25551 Pain in right hip: Secondary | ICD-10-CM | POA: Diagnosis not present

## 2015-11-19 DIAGNOSIS — F0280 Dementia in other diseases classified elsewhere without behavioral disturbance: Secondary | ICD-10-CM | POA: Diagnosis not present

## 2015-11-22 DIAGNOSIS — M7061 Trochanteric bursitis, right hip: Secondary | ICD-10-CM | POA: Diagnosis not present

## 2015-11-22 DIAGNOSIS — F0280 Dementia in other diseases classified elsewhere without behavioral disturbance: Secondary | ICD-10-CM | POA: Diagnosis not present

## 2015-11-22 DIAGNOSIS — R262 Difficulty in walking, not elsewhere classified: Secondary | ICD-10-CM | POA: Diagnosis not present

## 2015-11-22 DIAGNOSIS — M25551 Pain in right hip: Secondary | ICD-10-CM | POA: Diagnosis not present

## 2015-11-24 DIAGNOSIS — M7061 Trochanteric bursitis, right hip: Secondary | ICD-10-CM | POA: Diagnosis not present

## 2015-11-24 DIAGNOSIS — M25551 Pain in right hip: Secondary | ICD-10-CM | POA: Diagnosis not present

## 2015-11-24 DIAGNOSIS — R262 Difficulty in walking, not elsewhere classified: Secondary | ICD-10-CM | POA: Diagnosis not present

## 2015-11-24 DIAGNOSIS — F0280 Dementia in other diseases classified elsewhere without behavioral disturbance: Secondary | ICD-10-CM | POA: Diagnosis not present

## 2015-11-26 DIAGNOSIS — R262 Difficulty in walking, not elsewhere classified: Secondary | ICD-10-CM | POA: Diagnosis not present

## 2015-11-26 DIAGNOSIS — F0280 Dementia in other diseases classified elsewhere without behavioral disturbance: Secondary | ICD-10-CM | POA: Diagnosis not present

## 2015-11-26 DIAGNOSIS — M7061 Trochanteric bursitis, right hip: Secondary | ICD-10-CM | POA: Diagnosis not present

## 2015-11-26 DIAGNOSIS — M25551 Pain in right hip: Secondary | ICD-10-CM | POA: Diagnosis not present

## 2015-11-29 DIAGNOSIS — M25551 Pain in right hip: Secondary | ICD-10-CM | POA: Diagnosis not present

## 2015-11-29 DIAGNOSIS — M7061 Trochanteric bursitis, right hip: Secondary | ICD-10-CM | POA: Diagnosis not present

## 2015-11-29 DIAGNOSIS — X32XXXD Exposure to sunlight, subsequent encounter: Secondary | ICD-10-CM | POA: Diagnosis not present

## 2015-11-29 DIAGNOSIS — R262 Difficulty in walking, not elsewhere classified: Secondary | ICD-10-CM | POA: Diagnosis not present

## 2015-11-29 DIAGNOSIS — F0280 Dementia in other diseases classified elsewhere without behavioral disturbance: Secondary | ICD-10-CM | POA: Diagnosis not present

## 2015-11-29 DIAGNOSIS — D485 Neoplasm of uncertain behavior of skin: Secondary | ICD-10-CM | POA: Diagnosis not present

## 2015-11-29 DIAGNOSIS — L570 Actinic keratosis: Secondary | ICD-10-CM | POA: Diagnosis not present

## 2015-12-01 DIAGNOSIS — R262 Difficulty in walking, not elsewhere classified: Secondary | ICD-10-CM | POA: Diagnosis not present

## 2015-12-01 DIAGNOSIS — F0280 Dementia in other diseases classified elsewhere without behavioral disturbance: Secondary | ICD-10-CM | POA: Diagnosis not present

## 2015-12-01 DIAGNOSIS — M7061 Trochanteric bursitis, right hip: Secondary | ICD-10-CM | POA: Diagnosis not present

## 2015-12-01 DIAGNOSIS — M25551 Pain in right hip: Secondary | ICD-10-CM | POA: Diagnosis not present

## 2015-12-03 DIAGNOSIS — R262 Difficulty in walking, not elsewhere classified: Secondary | ICD-10-CM | POA: Diagnosis not present

## 2015-12-03 DIAGNOSIS — F0280 Dementia in other diseases classified elsewhere without behavioral disturbance: Secondary | ICD-10-CM | POA: Diagnosis not present

## 2015-12-03 DIAGNOSIS — M25551 Pain in right hip: Secondary | ICD-10-CM | POA: Diagnosis not present

## 2015-12-03 DIAGNOSIS — M7061 Trochanteric bursitis, right hip: Secondary | ICD-10-CM | POA: Diagnosis not present

## 2015-12-06 DIAGNOSIS — M25551 Pain in right hip: Secondary | ICD-10-CM | POA: Diagnosis not present

## 2015-12-06 DIAGNOSIS — R262 Difficulty in walking, not elsewhere classified: Secondary | ICD-10-CM | POA: Diagnosis not present

## 2015-12-06 DIAGNOSIS — M7061 Trochanteric bursitis, right hip: Secondary | ICD-10-CM | POA: Diagnosis not present

## 2015-12-06 DIAGNOSIS — F0280 Dementia in other diseases classified elsewhere without behavioral disturbance: Secondary | ICD-10-CM | POA: Diagnosis not present

## 2015-12-08 DIAGNOSIS — R262 Difficulty in walking, not elsewhere classified: Secondary | ICD-10-CM | POA: Diagnosis not present

## 2015-12-08 DIAGNOSIS — M7061 Trochanteric bursitis, right hip: Secondary | ICD-10-CM | POA: Diagnosis not present

## 2015-12-08 DIAGNOSIS — F0280 Dementia in other diseases classified elsewhere without behavioral disturbance: Secondary | ICD-10-CM | POA: Diagnosis not present

## 2015-12-08 DIAGNOSIS — M25551 Pain in right hip: Secondary | ICD-10-CM | POA: Diagnosis not present

## 2015-12-13 DIAGNOSIS — F0280 Dementia in other diseases classified elsewhere without behavioral disturbance: Secondary | ICD-10-CM | POA: Diagnosis not present

## 2015-12-13 DIAGNOSIS — M25551 Pain in right hip: Secondary | ICD-10-CM | POA: Diagnosis not present

## 2015-12-13 DIAGNOSIS — R262 Difficulty in walking, not elsewhere classified: Secondary | ICD-10-CM | POA: Diagnosis not present

## 2015-12-13 DIAGNOSIS — M7061 Trochanteric bursitis, right hip: Secondary | ICD-10-CM | POA: Diagnosis not present

## 2015-12-16 DIAGNOSIS — F0280 Dementia in other diseases classified elsewhere without behavioral disturbance: Secondary | ICD-10-CM | POA: Diagnosis not present

## 2015-12-16 DIAGNOSIS — M25551 Pain in right hip: Secondary | ICD-10-CM | POA: Diagnosis not present

## 2015-12-16 DIAGNOSIS — M7061 Trochanteric bursitis, right hip: Secondary | ICD-10-CM | POA: Diagnosis not present

## 2015-12-16 DIAGNOSIS — R262 Difficulty in walking, not elsewhere classified: Secondary | ICD-10-CM | POA: Diagnosis not present

## 2015-12-17 ENCOUNTER — Emergency Department (HOSPITAL_COMMUNITY): Payer: Medicare Other

## 2015-12-17 ENCOUNTER — Encounter (HOSPITAL_COMMUNITY): Payer: Self-pay | Admitting: Emergency Medicine

## 2015-12-17 ENCOUNTER — Emergency Department (HOSPITAL_COMMUNITY)
Admission: EM | Admit: 2015-12-17 | Discharge: 2015-12-18 | Disposition: A | Discharge status: Home / Self Care | Payer: Medicare Other | Attending: Emergency Medicine | Admitting: Emergency Medicine

## 2015-12-17 DIAGNOSIS — Y999 Unspecified external cause status: Secondary | ICD-10-CM | POA: Insufficient documentation

## 2015-12-17 DIAGNOSIS — Y939 Activity, unspecified: Secondary | ICD-10-CM | POA: Diagnosis not present

## 2015-12-17 DIAGNOSIS — R0781 Pleurodynia: Secondary | ICD-10-CM | POA: Diagnosis not present

## 2015-12-17 DIAGNOSIS — R55 Syncope and collapse: Secondary | ICD-10-CM | POA: Diagnosis not present

## 2015-12-17 DIAGNOSIS — S069X9A Unspecified intracranial injury with loss of consciousness of unspecified duration, initial encounter: Secondary | ICD-10-CM | POA: Diagnosis not present

## 2015-12-17 DIAGNOSIS — R739 Hyperglycemia, unspecified: Secondary | ICD-10-CM

## 2015-12-17 DIAGNOSIS — W19XXXA Unspecified fall, initial encounter: Secondary | ICD-10-CM | POA: Diagnosis not present

## 2015-12-17 DIAGNOSIS — S299XXA Unspecified injury of thorax, initial encounter: Secondary | ICD-10-CM | POA: Diagnosis not present

## 2015-12-17 DIAGNOSIS — I10 Essential (primary) hypertension: Secondary | ICD-10-CM | POA: Diagnosis not present

## 2015-12-17 DIAGNOSIS — Z79899 Other long term (current) drug therapy: Secondary | ICD-10-CM | POA: Diagnosis not present

## 2015-12-17 DIAGNOSIS — Z7982 Long term (current) use of aspirin: Secondary | ICD-10-CM | POA: Diagnosis not present

## 2015-12-17 DIAGNOSIS — E1165 Type 2 diabetes mellitus with hyperglycemia: Secondary | ICD-10-CM | POA: Diagnosis not present

## 2015-12-17 DIAGNOSIS — Z794 Long term (current) use of insulin: Secondary | ICD-10-CM | POA: Diagnosis not present

## 2015-12-17 DIAGNOSIS — Y92002 Bathroom of unspecified non-institutional (private) residence single-family (private) house as the place of occurrence of the external cause: Secondary | ICD-10-CM | POA: Diagnosis not present

## 2015-12-17 DIAGNOSIS — R404 Transient alteration of awareness: Secondary | ICD-10-CM | POA: Diagnosis not present

## 2015-12-17 LAB — URINALYSIS, ROUTINE W REFLEX MICROSCOPIC
Bilirubin Urine: NEGATIVE
Glucose, UA: >1000 mg/dL — AB
Ketones, ur: NEGATIVE mg/dL
NITRITE: NEGATIVE
PH: 6.5 (ref 5.0–8.0)
Protein, ur: NEGATIVE mg/dL
SPECIFIC GRAVITY, URINE: 1.028 (ref 1.005–1.030)

## 2015-12-17 LAB — URINE MICROSCOPIC-ADD ON

## 2015-12-17 LAB — LACTIC ACID, PLASMA: Lactic Acid, Venous: 1.8 mmol/L (ref 0.5–1.9)

## 2015-12-17 LAB — CBG MONITORING, ED
GLUCOSE-CAPILLARY: 349 mg/dL — AB (ref 65–99)
GLUCOSE-CAPILLARY: 312 mg/dL — AB (ref 65–99)
Glucose-Capillary: 240 mg/dL — ABNORMAL HIGH (ref 65–99)
Glucose-Capillary: 308 mg/dL — ABNORMAL HIGH (ref 65–99)

## 2015-12-17 LAB — BASIC METABOLIC PANEL
ANION GAP: 5 (ref 5–15)
BUN: 19 mg/dL (ref 6–20)
CALCIUM: 9.1 mg/dL (ref 8.9–10.3)
CHLORIDE: 102 mmol/L (ref 101–111)
CO2: 27 mmol/L (ref 22–32)
Creatinine, Ser: 0.95 mg/dL (ref 0.61–1.24)
GFR calc non Af Amer: >60 mL/min (ref >60)
Glucose, Bld: 360 mg/dL — ABNORMAL HIGH (ref 65–99)
POTASSIUM: 4.2 mmol/L (ref 3.5–5.1)
Sodium: 134 mmol/L — ABNORMAL LOW (ref 135–145)

## 2015-12-17 LAB — CBC
HEMATOCRIT: 38.3 % — AB (ref 39.0–52.0)
HEMOGLOBIN: 13.1 g/dL (ref 13.0–17.0)
MCH: 31 pg (ref 26.0–34.0)
MCHC: 34.2 g/dL (ref 30.0–36.0)
MCV: 90.8 fL (ref 78.0–100.0)
Platelets: 206 10*3/uL (ref 150–400)
RBC: 4.22 MIL/uL (ref 4.22–5.81)
RDW: 12.3 % (ref 11.5–15.5)
WBC: 6.6 10*3/uL (ref 4.0–10.5)

## 2015-12-17 LAB — I-STAT TROPONIN, ED: TROPONIN I, POC: 0 ng/mL (ref 0.00–0.08)

## 2015-12-17 MED ORDER — INSULIN ASPART 100 UNIT/ML ~~LOC~~ SOLN
5.0000 [IU] | Freq: Once | SUBCUTANEOUS | Status: AC
  Administered 2015-12-17: 5 [IU] via SUBCUTANEOUS
  Filled 2015-12-17: qty 1

## 2015-12-17 MED ORDER — SODIUM CHLORIDE 0.9 % IV BOLUS (SEPSIS)
500.0000 mL | Freq: Once | INTRAVENOUS | Status: AC
  Administered 2015-12-17: 500 mL via INTRAVENOUS

## 2015-12-17 NOTE — ED Provider Notes (Signed)
[facility] DEPT Provider Note   CSN: [id number] Arrival date & time: 12/17/15  1949  First Provider Contact:  First MD Initiated Contact with Patient 12/17/15 2011     By signing my name below, I, Dora Sims, attest that this documentation has been prepared under the direction and in the presence of Charlann Lange, PA-C. Electronically Signed: Dora Sims, Scribe. 12/17/2015. 8:14 PM.   History   Chief Complaint Chief Complaint  Patient presents with  . Loss of Consciousness  . Hyperglycemia    The history is provided by the patient and the spouse. No language interpreter was used.     HPI Comments: [name] is a 82 y.o. male brought in by EMS, with PMHx of HTN, HLD, and DM, who presents to the Emergency Department complaining of fall and loss of consciousness occurring shortly PTA. Per his wife, pt went to the bathroom this evening and was in there for a long time so she went to check on him; she says he was lying on his back on the floor but was alert. Pt states he does not remember the event. His wife reports pt could not stand up after the fall due to pain and weakness in his right leg, so she called 911. She states pt "slid down the wall" before using the bathroom and ended up on his back. Pt denies any right leg pain or weakness currently. His wife reports pt's blood sugar was measured at 485 tonight by EMS which is the highest it has ever been. She notes pt stumbled and struck his left abdomen on his walker 2-3 weeks ago; he has experienced ongoing pain and ecchymosis to his left abdomen ever since. He states his abdominal pain is exacerbated with lying down. Per his wife, pt has experienced some episodes of confusion recently. She notes pt had an "abnormal bowel movement" earlier today. Pt lives at [facility]. Pt ambulates with a walker at baseline. He denies SOB, recent illness, nausea, vomiting, neuro deficits, or any other associated symptoms.  Past  Medical History:  Diagnosis Date  . Diabetes mellitus without complication ([place])   . Hyperlipidemia   . Hypertension     There are no active problems to display for this patient.   Past Surgical History:  Procedure Laterality Date  . PROSTATE SURGERY    . rotator cuff surgery    . TONSILLECTOMY         Home Medications    Prior to Admission medications   Medication Sig Start Date End Date Taking? Authorizing Provider  aspirin 81 MG tablet Take 81 mg by mouth daily.    Historical Provider, MD  atorvastatin (LIPITOR) 10 MG tablet Take 10 mg by mouth daily.    Historical Provider, MD  Insulin Glargine (LANTUS SOLOSTAR ) Inject into the skin.    Historical Provider, MD  sitaGLIPtin-metformin (JANUMET) 50-1000 MG per tablet Take 1 tablet by mouth 2 (two) times daily with a meal.    Historical Provider, MD    Family History History reviewed. No pertinent family history.  Social History Social History  Substance Use Topics  . Smoking status: Never Smoker  . Smokeless tobacco: Never Used  . Alcohol use No     Allergies   Review of patient's allergies indicates no known allergies.   Review of Systems Review of Systems  Gastrointestinal: Positive for abdominal pain (left abdomen). Negative for nausea and vomiting.  Musculoskeletal: Positive for myalgias (RLE, resolved).  Neurological: Positive  for syncope and weakness (RLE, resolved).       Negative for sensation loss.  Psychiatric/Behavioral: Positive for confusion (intermittent episodes).     Physical Exam Updated Vital Signs BP 158/73 (BP Location: Left Arm)   Pulse 79   Temp 98.5 F (36.9 C) (Oral) Comment: pt CBG 349  Resp 19   Ht 5\' 10"  (1.778 m)   Wt 135 lb (61.2 kg)   SpO2 97%   BMI 19.37 kg/m   Physical Exam  Constitutional: He is oriented to person, place, and time. He appears well-developed and well-nourished. No distress.  HENT:  Head: Normocephalic and atraumatic.  Eyes: Conjunctivae and  EOM are normal.  No pallor.  Neck: Neck supple. No tracheal deviation present.  Neck is supple.  Cardiovascular: Normal rate.   No murmur heard. Pulmonary/Chest: Effort normal. No respiratory distress.  Lungs are clear to auscultation.  Abdominal: There is no tenderness.  Abdomen appears atraumatic. It is non-tender.  Musculoskeletal: Normal range of motion.  Back appears atraumatic and is non-tender, specifically no cervical tenderness. FROM of all extremities without asymmetric strength deficits.  Neurological: He is alert and oriented to person, place, and time.  Alert. Speech is clear, no aphasia. Equal sensation in bilateral upper and lower extremities.  Skin: Skin is warm and dry.  Psychiatric: He has a normal mood and affect. His behavior is normal.  Nursing note and vitals reviewed.    ED Treatments / Results  Labs (all labs ordered are listed, but only abnormal results are displayed) Labs Reviewed  CBG MONITORING, ED - Abnormal; Notable for the following:       Result Value   Glucose-Capillary 349 (*)    All other components within normal limits  BASIC METABOLIC PANEL  CBC  URINALYSIS, ROUTINE W REFLEX MICROSCOPIC (NOT AT [facility])  I-STAT TROPOININ, ED   Results for orders placed or performed during the hospital encounter of [id number]  Basic metabolic panel  Result Value Ref Range   Sodium 134 (L) 135 - 145 mmol/L   Potassium 4.2 3.5 - 5.1 mmol/L   Chloride 102 101 - 111 mmol/L   CO2 27 22 - 32 mmol/L   Glucose, Bld 360 (H) 65 - 99 mg/dL   BUN 19 6 - 20 mg/dL   Creatinine, Ser 0.95 0.61 - 1.24 mg/dL   Calcium 9.1 8.9 - 10.3 mg/dL   GFR calc non Af Amer >60 >60 mL/min   GFR calc Af Amer >60 >60 mL/min   Anion gap 5 5 - 15  CBC  Result Value Ref Range   WBC 6.6 4.0 - 10.5 K/uL   RBC 4.22 4.22 - 5.81 MIL/uL   Hemoglobin 13.1 13.0 - 17.0 g/dL   HCT 38.3 (L) 39.0 - 52.0 %   MCV 90.8 78.0 - 100.0 fL   MCH 31.0 26.0 - 34.0 pg   MCHC 34.2 30.0 - 36.0 g/dL   RDW  12.3 11.5 - 15.5 %   Platelets 206 150 - 400 K/uL  Urinalysis, Routine w reflex microscopic  Result Value Ref Range   Color, Urine YELLOW YELLOW   APPearance CLEAR CLEAR   Specific Gravity, Urine 1.028 1.005 - 1.030   pH 6.5 5.0 - 8.0   Glucose, UA >1000 (A) NEGATIVE mg/dL   Hgb urine dipstick TRACE (A) NEGATIVE   Bilirubin Urine NEGATIVE NEGATIVE   Ketones, ur NEGATIVE NEGATIVE mg/dL   Protein, ur NEGATIVE NEGATIVE mg/dL   Nitrite NEGATIVE NEGATIVE   Leukocytes, UA SMALL (A)  NEGATIVE  Lactic acid, plasma  Result Value Ref Range   Lactic Acid, Venous 1.8 0.5 - 1.9 mmol/L  Urine microscopic-add on  Result Value Ref Range   Squamous Epithelial / LPF 0-5 (A) NONE SEEN   WBC, UA 6-30 0 - 5 WBC/hpf   RBC / HPF 0-5 0 - 5 RBC/hpf   Bacteria, UA RARE (A) NONE SEEN  CBG monitoring, ED  Result Value Ref Range   Glucose-Capillary 349 (H) 65 - 99 mg/dL   Comment 1 Notify RN    Comment 2 Document in Chart   I-stat troponin, ED (not at [facility])  Result Value Ref Range   Troponin i, poc 0.00 0.00 - 0.08 ng/mL   Comment 3          POC CBG, ED  Result Value Ref Range   Glucose-Capillary 312 (H) 65 - 99 mg/dL  CBG monitoring, ED  Result Value Ref Range   Glucose-Capillary 308 (H) 65 - 99 mg/dL  POC CBG, ED  Result Value Ref Range   Glucose-Capillary 240 (H) 65 - 99 mg/dL     EKG  EKG Interpretation None       Radiology No results found.  Procedures Procedures (including critical care time)  DIAGNOSTIC STUDIES: Oxygen Saturation is 97% on RA, normal by my interpretation.    COORDINATION OF CARE: 8:25 PM Discussed treatment plan with pt at bedside and pt agreed to plan.   Medications Ordered in ED Medications - No data to display   Initial Impression / Assessment and Plan / ED Course  I have reviewed the triage vital signs and the nursing notes.  Pertinent labs & imaging results that were available during my care of the patient were reviewed by me and considered  in my medical decision making (see chart for details).  Clinical Course    Patient presents after near syncopal event associated with large bowel movement just prior to arrival. He is back to baseline currently. No obvious injury. No evidence of infection. No concern for CVA. He is found to be hyperglycemic without acidosis.   He is examined by Dr. Jeneen Rinks. IV fluids and insulin provided in order to decrease high blood sugar. He is felt appropriate for discharge home with likely vasovagal symptoms. Family is comfortable with discharge home.  . I personally performed the services described in this documentation, which was scribed in my presence. The recorded information has been reviewed and is accurate.    Final Clinical Impressions(s) / ED Diagnoses   Final diagnoses:  None   1. Vasovagal episode 2. Hyperglycemia  New Prescriptions New Prescriptions   No medications on file     Charlann Lange, Hershal Coria 12/18/15 0014    Tanna Furry, MD 12/21/15 1145

## 2015-12-17 NOTE — ED Notes (Signed)
Bed: [id number] Expected date:  Expected time:  Means of arrival:  Comments: EMS 82 yo male, hyperglycemia IVF's per EMS

## 2015-12-17 NOTE — ED Provider Notes (Signed)
Patient seen and examined. Discussed with Frederich Balding PA-C. Patient had an episode at his assisted living facility tonight.  He was on his way to the bathroom. Wife wanted to check on him and he was on the floor. His pants around his ankles. She alleges that he was trying to get his pants off but he still had shoes on. He may been a vagal episode was shortly thereafter when he was back up he had a large bowel movement that apparently impressed the assisted living facility nurse with its size and volume.   Since that time he has felt well and had no other symptoms. He denies chest pain no shortness of breath no diarrhea has been eating and otherwise functioning well at home.  Here he feels well. His wife was concerned because her sugar was over 370. He takes lantis in the morning and is normally "not very high".  He has some tenderness across his upper abdomen and lower ribs. He has no tenderness to palpation of the liver or spleen. He had a fall over and Benadryl a few weeks ago. His hemoglobin is normal here. I do not suspect any intra-abdominal injury. Think this is very likely vagal episode. He was given some fluids. He is given subcutaneous insulin and we will recheck her level to ensure that he is improving. Is very likely stress response due to his episode tonight. He feels well and family is comfortable with him at home. Rest and return if any worsening or recurrence of symptoms.   Tanna Furry, MD 12/17/15 [phone]

## 2015-12-17 NOTE — ED Triage Notes (Signed)
Brought in by EMS from [facility] facility (from [facility]) with c/o syncope and hyperglycemia.  Per EMS, home health nurse reported that pt has had syncopal episode today while using bathroom---- no apparent injury from fall noted with this fall.  Pt's CBG was 485 by EMS.  Per pt's family, pt has had a fall 2 weeks ago and has been complaining of left rib cage since fall--- family wants this evaluated also.  Pt has hx of dementia.

## 2015-12-17 NOTE — ED Notes (Signed)
Unable to collect lab patient is not in the room

## 2015-12-19 LAB — URINE CULTURE

## 2015-12-20 DIAGNOSIS — F0280 Dementia in other diseases classified elsewhere without behavioral disturbance: Secondary | ICD-10-CM | POA: Diagnosis not present

## 2015-12-20 DIAGNOSIS — M25551 Pain in right hip: Secondary | ICD-10-CM | POA: Diagnosis not present

## 2015-12-20 DIAGNOSIS — M7061 Trochanteric bursitis, right hip: Secondary | ICD-10-CM | POA: Diagnosis not present

## 2015-12-20 DIAGNOSIS — R262 Difficulty in walking, not elsewhere classified: Secondary | ICD-10-CM | POA: Diagnosis not present

## 2015-12-31 ENCOUNTER — Emergency Department (HOSPITAL_BASED_OUTPATIENT_CLINIC_OR_DEPARTMENT_OTHER)
Admission: EM | Admit: 2015-12-31 | Discharge: 2015-12-31 | Disposition: A | Discharge status: Home / Self Care | Payer: Medicare Other | Attending: Emergency Medicine | Admitting: Emergency Medicine

## 2015-12-31 ENCOUNTER — Encounter (HOSPITAL_BASED_OUTPATIENT_CLINIC_OR_DEPARTMENT_OTHER): Payer: Self-pay | Admitting: Emergency Medicine

## 2015-12-31 DIAGNOSIS — Z79899 Other long term (current) drug therapy: Secondary | ICD-10-CM | POA: Diagnosis not present

## 2015-12-31 DIAGNOSIS — Z7982 Long term (current) use of aspirin: Secondary | ICD-10-CM | POA: Diagnosis not present

## 2015-12-31 DIAGNOSIS — R739 Hyperglycemia, unspecified: Secondary | ICD-10-CM

## 2015-12-31 DIAGNOSIS — Z794 Long term (current) use of insulin: Secondary | ICD-10-CM | POA: Insufficient documentation

## 2015-12-31 DIAGNOSIS — R7889 Finding of other specified substances, not normally found in blood: Secondary | ICD-10-CM | POA: Diagnosis not present

## 2015-12-31 DIAGNOSIS — I10 Essential (primary) hypertension: Secondary | ICD-10-CM | POA: Insufficient documentation

## 2015-12-31 DIAGNOSIS — R7309 Other abnormal glucose: Secondary | ICD-10-CM | POA: Diagnosis not present

## 2015-12-31 DIAGNOSIS — Z8551 Personal history of malignant neoplasm of bladder: Secondary | ICD-10-CM | POA: Insufficient documentation

## 2015-12-31 DIAGNOSIS — E1165 Type 2 diabetes mellitus with hyperglycemia: Secondary | ICD-10-CM | POA: Insufficient documentation

## 2015-12-31 HISTORY — DX: Malignant neoplasm of bladder, unspecified: C67.9

## 2015-12-31 HISTORY — DX: Unspecified dementia, unspecified severity, without behavioral disturbance, psychotic disturbance, mood disturbance, and anxiety: F03.90

## 2015-12-31 HISTORY — DX: Age-related osteoporosis without current pathological fracture: M81.0

## 2015-12-31 LAB — BASIC METABOLIC PANEL
Anion gap: 5 (ref 5–15)
BUN: 15 mg/dL (ref 6–20)
CALCIUM: 9 mg/dL (ref 8.9–10.3)
CO2: 30 mmol/L (ref 22–32)
CREATININE: 0.87 mg/dL (ref 0.61–1.24)
Chloride: 99 mmol/L — ABNORMAL LOW (ref 101–111)
GFR calc Af Amer: >60 mL/min (ref >60)
GFR calc non Af Amer: >60 mL/min (ref >60)
GLUCOSE: 387 mg/dL — AB (ref 65–99)
Potassium: 4.3 mmol/L (ref 3.5–5.1)
Sodium: 134 mmol/L — ABNORMAL LOW (ref 135–145)

## 2015-12-31 LAB — CBG MONITORING, ED
Glucose-Capillary: 419 mg/dL — ABNORMAL HIGH (ref 65–99)
Glucose-Capillary: 281 mg/dL — ABNORMAL HIGH (ref 65–99)

## 2015-12-31 LAB — CBC WITH DIFFERENTIAL/PLATELET
Basophils Absolute: 0 K/uL (ref 0.0–0.1)
Basophils Relative: 0 %
Eosinophils Absolute: 0.2 K/uL (ref 0.0–0.7)
Eosinophils Relative: 3 %
HCT: 38.9 % — ABNORMAL LOW (ref 39.0–52.0)
Hemoglobin: 13.6 g/dL (ref 13.0–17.0)
Lymphocytes Relative: 19 %
Lymphs Abs: 1.3 K/uL (ref 0.7–4.0)
MCH: 31.6 pg (ref 26.0–34.0)
MCHC: 35 g/dL (ref 30.0–36.0)
MCV: 90.5 fL (ref 78.0–100.0)
Monocytes Absolute: 0.6 K/uL (ref 0.1–1.0)
Monocytes Relative: 9 %
Neutro Abs: 4.6 K/uL (ref 1.7–7.7)
Neutrophils Relative %: 69 %
Platelets: 205 K/uL (ref 150–400)
RBC: 4.3 MIL/uL (ref 4.22–5.81)
RDW: 12.3 % (ref 11.5–15.5)
WBC: 6.7 K/uL (ref 4.0–10.5)

## 2015-12-31 MED ORDER — INSULIN REGULAR HUMAN 100 UNIT/ML IJ SOLN
6.0000 [IU] | Freq: Once | INTRAMUSCULAR | Status: AC
  Administered 2015-12-31: 6 [IU] via SUBCUTANEOUS
  Filled 2015-12-31: qty 1

## 2015-12-31 MED ORDER — SODIUM CHLORIDE 0.9 % IV SOLN
INTRAVENOUS | Status: DC
  Administered 2015-12-31: 19:00:00 via INTRAVENOUS

## 2015-12-31 MED ORDER — SODIUM CHLORIDE 0.9 % IV BOLUS (SEPSIS)
250.0000 mL | Freq: Once | INTRAVENOUS | Status: AC
  Administered 2015-12-31: 250 mL via INTRAVENOUS

## 2015-12-31 NOTE — ED Triage Notes (Signed)
Glucose noted to be elevated at [facility] today.  Similar episode 2 weeks ago and was seen at [facility].

## 2015-12-31 NOTE — ED Notes (Signed)
Attempted to obtain urine sample without success

## 2015-12-31 NOTE — ED Provider Notes (Signed)
[facility] DEPT Provider Note   CSN: [id number] Arrival date & time: 12/31/15  1802  By signing my name below, I, Soijett Blue, attest that this documentation has been prepared under the direction and in the presence of Charlesetta Shanks, MD. Electronically Signed: Soijett Blue, ED Scribe. 12/31/15. 6:23 PM.   History   Chief Complaint Chief Complaint  Patient presents with  . Hyperglycemia    HPI  [name] is a 82 y.o. male who presents to the Emergency Department brought in by EMS, complaining of hyperglycemia onset PTA. Wife notes that the pt was seen at [facility] for hyperglycemia and was treated for his symptoms. Wife reports that she left the pt alone today to get her hair done and she returned to find the pt in the floor. When the pt wife asked the pt how the incident occurred, the pt was unsure. Wife states that the pt last had 10 mg of lantus at 3 PM today and she denies the pt taking lantus with his meals, and notes that the pt only takes lantus in the morning. Wife reports that she called the nurse at [facility] for evaluation of the pt symptoms and then checked his sugar which was initially 296 and it rose to the mid 300's prior to arrival. Wife states that she was informed by the nurse at [facility] that the pt may need to be brought into the ED for further evaluation. He states that he has tried 10 mg lantus with no relief for his symptoms. He denies fever, chills, abdominal pain, CP, SOB, and any other symptoms.     The history is provided by the patient and the spouse. No language interpreter was used.    Past Medical History:  Diagnosis Date  . Bladder cancer ([place])   . Dementia   . Diabetes mellitus without complication ([place])   . Hyperlipidemia   . Hypertension   . Osteoporosis     There are no active problems to display for this patient.   Past Surgical History:  Procedure Laterality Date  . PROSTATE SURGERY    . rotator cuff surgery    .  TONSILLECTOMY         Home Medications    Prior to Admission medications   Medication Sig Start Date End Date Taking? Authorizing Provider  alendronate (FOSAMAX) 70 MG tablet Take 70 mg by mouth once a week. Take with a full glass of water on an empty stomach.   Yes Historical Provider, MD  aspirin 81 MG tablet Take 81 mg by mouth daily.    Historical Provider, MD  atorvastatin (LIPITOR) 10 MG tablet Take 10 mg by mouth daily.    Historical Provider, MD  Cholecalciferol (VITAMIN D PO) Take 1 tablet by mouth daily.    Historical Provider, MD  donepezil (ARICEPT) 10 MG tablet Take 10 mg by mouth at bedtime. 12/06/15   Historical Provider, MD  ibuprofen (ADVIL,MOTRIN) 200 MG tablet Take 200-400 mg by mouth every 6 (six) hours as needed for moderate pain.    Historical Provider, MD  Insulin Glargine (LANTUS SOLOSTAR [place]) Inject 10 Units into the skin every morning.     Historical Provider, MD    Family History No family history on file.  Social History Social History  Substance Use Topics  . Smoking status: Never Smoker  . Smokeless tobacco: Never Used  . Alcohol use No     Allergies   Review of patient's allergies indicates no known allergies.  Review of Systems Review of Systems  Constitutional: Negative for chills.     Physical Exam Updated Vital Signs BP 108/94 (BP Location: Left Arm)   Pulse 75   Temp 98.4 F (36.9 C) (Oral)   Resp 16   Ht 5\' 10"  (1.778 m)   SpO2 98%   Physical Exam  Constitutional: He is oriented to person, place, and time. He appears well-developed and well-nourished. No distress.  HENT:  Head: Normocephalic and atraumatic.  Mouth/Throat: Oropharynx is clear and moist and mucous membranes are normal.  mucous membranes pink and moist.   Eyes: EOM are normal.  Neck: Neck supple.  Cardiovascular: Normal rate, regular rhythm and normal heart sounds.  Exam reveals no gallop and no friction rub.   No murmur heard. Pulmonary/Chest: Effort  normal. No respiratory distress. He has no wheezes. He has no rhonchi. He has no rales.  Breathe sounds slightly soft at bases. Adequate airway.  Abdominal: Soft. He exhibits no distension. There is no tenderness.  Musculoskeletal: Normal range of motion. He exhibits no edema or deformity.  No deformities or contusions. No peripheral edema.  Neurological: He is alert and oriented to person, place, and time.  Skin: Skin is warm and dry.  Psychiatric: He has a normal mood and affect. His behavior is normal.  Nursing note and vitals reviewed.    ED Treatments / Results  DIAGNOSTIC STUDIES: Oxygen Saturation is 97% on RA, nl by my interpretation.    COORDINATION OF CARE: 6:21 PM Discussed treatment plan with pt at bedside which includes CBG and pt agreed to plan.   Labs (all labs ordered are listed, but only abnormal results are displayed) Labs Reviewed  BASIC METABOLIC PANEL - Abnormal; Notable for the following:       Result Value   Sodium 134 (*)    Chloride 99 (*)    Glucose, Bld 387 (*)    All other components within normal limits  CBC WITH DIFFERENTIAL/PLATELET - Abnormal; Notable for the following:    HCT 38.9 (*)    All other components within normal limits  CBG MONITORING, ED - Abnormal; Notable for the following:    Glucose-Capillary 419 (*)    All other components within normal limits  CBG MONITORING, ED - Abnormal; Notable for the following:    Glucose-Capillary 281 (*)    All other components within normal limits    EKG  EKG Interpretation None       Radiology No results found.  Procedures Procedures (including critical care time)  Medications Ordered in ED Medications  insulin regular (NOVOLIN R,HUMULIN R) 100 units/mL injection 6 Units (6 Units Subcutaneous Given 12/31/15 1840)  sodium chloride 0.9 % bolus 250 mL (0 mLs Intravenous Stopped 12/31/15 1854)     Initial Impression / Assessment and Plan / ED Course  I have reviewed the triage vital  signs and the nursing notes.  Pertinent labs & imaging results that were available during my care of the patient were reviewed by me and considered in my medical decision making (see chart for details).  Clinical Course     Final Clinical Impressions(s) / ED Diagnoses   Final diagnoses:  Hyperglycemia    New Prescriptions Discharge Medication List as of 12/31/2015  9:04 PM         Charlesetta Shanks, MD 01/25/16 2021

## 2015-12-31 NOTE — ED Notes (Signed)
Pt with no complaints other than high blood sugar today.

## 2015-12-31 NOTE — Discharge Instructions (Signed)
Take your Lantus dose in the morning. Increase your dose to 12 units. Continue to monitor your blood sugars at home and to keep a log period

## 2016-01-01 ENCOUNTER — Emergency Department (HOSPITAL_BASED_OUTPATIENT_CLINIC_OR_DEPARTMENT_OTHER)
Admission: EM | Admit: 2016-01-01 | Discharge: 2016-01-01 | Disposition: A | Discharge status: Home / Self Care | Payer: Medicare Other | Attending: Emergency Medicine | Admitting: Emergency Medicine

## 2016-01-01 ENCOUNTER — Encounter (HOSPITAL_BASED_OUTPATIENT_CLINIC_OR_DEPARTMENT_OTHER): Payer: Self-pay | Admitting: Emergency Medicine

## 2016-01-01 DIAGNOSIS — R5383 Other fatigue: Secondary | ICD-10-CM | POA: Insufficient documentation

## 2016-01-01 DIAGNOSIS — I10 Essential (primary) hypertension: Secondary | ICD-10-CM | POA: Insufficient documentation

## 2016-01-01 DIAGNOSIS — Z79899 Other long term (current) drug therapy: Secondary | ICD-10-CM | POA: Insufficient documentation

## 2016-01-01 DIAGNOSIS — R7889 Finding of other specified substances, not normally found in blood: Secondary | ICD-10-CM | POA: Diagnosis not present

## 2016-01-01 DIAGNOSIS — E1165 Type 2 diabetes mellitus with hyperglycemia: Secondary | ICD-10-CM | POA: Diagnosis not present

## 2016-01-01 DIAGNOSIS — Z7982 Long term (current) use of aspirin: Secondary | ICD-10-CM | POA: Insufficient documentation

## 2016-01-01 DIAGNOSIS — R7309 Other abnormal glucose: Secondary | ICD-10-CM | POA: Diagnosis not present

## 2016-01-01 DIAGNOSIS — Z8551 Personal history of malignant neoplasm of bladder: Secondary | ICD-10-CM | POA: Insufficient documentation

## 2016-01-01 DIAGNOSIS — R739 Hyperglycemia, unspecified: Secondary | ICD-10-CM

## 2016-01-01 LAB — URINALYSIS, ROUTINE W REFLEX MICROSCOPIC
Bilirubin Urine: NEGATIVE
KETONES UR: 15 mg/dL — AB
LEUKOCYTES UA: NEGATIVE
Nitrite: NEGATIVE
PH: 5.5 (ref 5.0–8.0)
Protein, ur: NEGATIVE mg/dL
Specific Gravity, Urine: 1.035 — ABNORMAL HIGH (ref 1.005–1.030)

## 2016-01-01 LAB — URINE MICROSCOPIC-ADD ON

## 2016-01-01 LAB — CBG MONITORING, ED
GLUCOSE-CAPILLARY: 225 mg/dL — AB (ref 65–99)
Glucose-Capillary: 331 mg/dL — ABNORMAL HIGH (ref 65–99)

## 2016-01-01 MED ORDER — SODIUM CHLORIDE 0.9 % IV BOLUS (SEPSIS)
1000.0000 mL | Freq: Once | INTRAVENOUS | Status: AC
  Administered 2016-01-01: 1000 mL via INTRAVENOUS

## 2016-01-01 NOTE — ED Provider Notes (Signed)
[facility] DEPT [facility] Provider Note   CSN: [id number] Arrival date & time: 01/01/16  1345     History   Chief Complaint Chief Complaint  Patient presents with  . Hyperglycemia    HPI [name] is a 82 y.o. male.  The history is provided by the spouse and the patient.  Hyperglycemia  Blood sugar level PTA:  380 Severity:  Moderate Onset quality:  Gradual Timing:  Constant Progression:  Unchanged Chronicity:  Chronic Diabetes status:  Controlled with insulin Current diabetic therapy:  Lantus 12units daily Time since last antidiabetic medication:  8 hours Context: change in medication   Context comment:  Was on invokana until april when he was taken off and has been doing lantus since but refused to check CBG until this week and has been elevated all week Relieved by:  Nothing Ineffective treatments:  Insulin Associated symptoms: fatigue   Associated symptoms: no abdominal pain, no chest pain, no confusion, no dehydration, no dizziness, no dysuria, no nausea, no shortness of breath, no weakness and no weight change     Past Medical History:  Diagnosis Date  . Bladder cancer ([place])   . Dementia   . Diabetes mellitus without complication ([place])   . Hyperlipidemia   . Hypertension   . Osteoporosis     There are no active problems to display for this patient.   Past Surgical History:  Procedure Laterality Date  . PROSTATE SURGERY    . rotator cuff surgery    . TONSILLECTOMY         Home Medications    Prior to Admission medications   Medication Sig Start Date End Date Taking? Authorizing Provider  alendronate (FOSAMAX) 70 MG tablet Take 70 mg by mouth once a week. Take with a full glass of water on an empty stomach.    Historical Provider, MD  aspirin 81 MG tablet Take 81 mg by mouth daily.    Historical Provider, MD  atorvastatin (LIPITOR) 10 MG tablet Take 10 mg by mouth daily.    Historical Provider, MD  Cholecalciferol (VITAMIN D PO) Take 1 tablet  by mouth daily.    Historical Provider, MD  donepezil (ARICEPT) 10 MG tablet Take 10 mg by mouth at bedtime. 12/06/15   Historical Provider, MD  ibuprofen (ADVIL,MOTRIN) 200 MG tablet Take 200-400 mg by mouth every 6 (six) hours as needed for moderate pain.    Historical Provider, MD  Insulin Glargine (LANTUS SOLOSTAR [place]) Inject 10 Units into the skin every morning.     Historical Provider, MD    Family History No family history on file.  Social History Social History  Substance Use Topics  . Smoking status: Never Smoker  . Smokeless tobacco: Never Used  . Alcohol use No     Allergies   Review of patient's allergies indicates no known allergies.   Review of Systems Review of Systems  Constitutional: Positive for fatigue.  Respiratory: Negative for shortness of breath.   Cardiovascular: Negative for chest pain.  Gastrointestinal: Negative for abdominal pain and nausea.  Genitourinary: Negative for dysuria.  Neurological: Negative for dizziness and weakness.  Psychiatric/Behavioral: Negative for confusion.  All other systems reviewed and are negative.    Physical Exam Updated Vital Signs BP 142/76 (BP Location: Left Arm)   Pulse 76   Temp 98.2 F (36.8 C) (Oral)   Resp 16   Ht 5\' 10"  (1.778 m)   Wt 135 lb (61.2 kg)   SpO2 96%  BMI 19.37 kg/m   Physical Exam  Constitutional: He is oriented to person, place, and time. He appears well-developed and well-nourished. No distress.  HENT:  Head: Normocephalic and atraumatic.  Mouth/Throat: Oropharynx is clear and moist.  Eyes: Conjunctivae and EOM are normal. Pupils are equal, round, and reactive to light.  Neck: Normal range of motion. Neck supple.  Cardiovascular: Normal rate, regular rhythm and intact distal pulses.   No murmur heard. Pulmonary/Chest: Effort normal and breath sounds normal. No respiratory distress. He has no wheezes. He has no rales.  Abdominal: Soft. He exhibits no distension. There is no  tenderness. There is no rebound and no guarding.  Musculoskeletal: Normal range of motion. He exhibits edema. He exhibits no tenderness.  1+ edema in bilateral ankles  Neurological: He is alert and oriented to person, place, and time.  Skin: Skin is warm and dry. No rash noted. No erythema.  Psychiatric: He has a normal mood and affect. His behavior is normal.  Nursing note and vitals reviewed.    ED Treatments / Results  Labs (all labs ordered are listed, but only abnormal results are displayed) Labs Reviewed  CBG MONITORING, ED - Abnormal; Notable for the following:       Result Value   Glucose-Capillary 331 (*)    All other components within normal limits  URINE CULTURE  URINALYSIS, ROUTINE W REFLEX MICROSCOPIC (NOT AT [facility])    EKG  EKG Interpretation None       Radiology No results found.  Procedures Procedures (including critical care time)  Medications Ordered in ED Medications  sodium chloride 0.9 % bolus 1,000 mL (not administered)     Initial Impression / Assessment and Plan / ED Course  I have reviewed the triage vital signs and the nursing notes.  Pertinent labs & imaging results that were available during my care of the patient were reviewed by me and considered in my medical decision making (see chart for details).  Clinical Course   Patient is an 82 year old male presenting today for persistent hyperglycemia. Patient was seen last night for the same and at that time had a normal CBC and CMP other than hyperglycemia. Patient currently is only taking Lantus once a day and does not use sliding scale insulin. Unclear how long he has had hyperglycemia because in April he stopped taking invokana per Dr.Kerr due to renal issues and at that time was started on another medication. Patient was refusing to check his blood sugar until this week and every time they've checked it is been elevated. Patient's wife states that she brought him back today because his blood  sugar remains elevated but she is also concerned about the potential for a urinary tract infection. Patient was seen on August 11 for falling and at that time had a UA that had a culture that showed multiples species.   Patient otherwise has no symptoms. He is awake and alert and in no acute distress. Vital signs are within normal limits. Patient's blood sugar here is in the 300s. He has eaten today and had 12 units of Lantus this morning.  No acute findings on exam.  Patient has an appointment with his endocrinologist on Tuesday. We'll check to ensure that patient does not have a UTI and patient given IV fluids however discussed with his wife that his sugars are not being controlled by them regime he is on at this time and they will most likely remain grater than desired and told he follows up  with his doctor and has adjustments of medication. Will increase Lantus to 15 units and encouraged continue diabetic diet.  4:06 PM No UTI and A1C done for endo on tues.  Final Clinical Impressions(s) / ED Diagnoses   Final diagnoses:  Hyperglycemia    New Prescriptions New Prescriptions   No medications on file     Blanchie Dessert, MD 01/01/16 1607

## 2016-01-01 NOTE — Discharge Instructions (Signed)
Increase lantus to 15 units every morning.  Only check blood sugar in the morning and night

## 2016-01-01 NOTE — ED Triage Notes (Signed)
Pt continues to have high BS.  381 cbg by EMS.  Wife states she found him lying beside the recliner this morning. No apparent injury.  Pt does not know how he got there. Pt did ambulate as usual with his walker afterwards.  Wife sts they did follow the insulin guidelines given to them here last night but the BS remains high.

## 2016-01-03 DIAGNOSIS — R296 Repeated falls: Secondary | ICD-10-CM | POA: Diagnosis not present

## 2016-01-03 DIAGNOSIS — R5381 Other malaise: Secondary | ICD-10-CM | POA: Diagnosis not present

## 2016-01-03 LAB — URINE CULTURE: Culture: NO GROWTH

## 2016-01-04 DIAGNOSIS — R262 Difficulty in walking, not elsewhere classified: Secondary | ICD-10-CM | POA: Diagnosis not present

## 2016-01-04 DIAGNOSIS — R4189 Other symptoms and signs involving cognitive functions and awareness: Secondary | ICD-10-CM | POA: Diagnosis not present

## 2016-01-04 DIAGNOSIS — M810 Age-related osteoporosis without current pathological fracture: Secondary | ICD-10-CM | POA: Diagnosis not present

## 2016-01-04 DIAGNOSIS — E1142 Type 2 diabetes mellitus with diabetic polyneuropathy: Secondary | ICD-10-CM | POA: Diagnosis not present

## 2016-01-04 DIAGNOSIS — R296 Repeated falls: Secondary | ICD-10-CM | POA: Diagnosis not present

## 2016-01-04 DIAGNOSIS — E7800 Pure hypercholesterolemia, unspecified: Secondary | ICD-10-CM | POA: Diagnosis not present

## 2016-01-04 DIAGNOSIS — Z794 Long term (current) use of insulin: Secondary | ICD-10-CM | POA: Diagnosis not present

## 2016-01-04 DIAGNOSIS — Z8551 Personal history of malignant neoplasm of bladder: Secondary | ICD-10-CM | POA: Diagnosis not present

## 2016-01-04 DIAGNOSIS — R41841 Cognitive communication deficit: Secondary | ICD-10-CM | POA: Diagnosis not present

## 2016-01-04 DIAGNOSIS — E1165 Type 2 diabetes mellitus with hyperglycemia: Secondary | ICD-10-CM | POA: Diagnosis not present

## 2016-01-04 DIAGNOSIS — F0390 Unspecified dementia without behavioral disturbance: Secondary | ICD-10-CM | POA: Diagnosis not present

## 2016-01-04 DIAGNOSIS — Z9181 History of falling: Secondary | ICD-10-CM | POA: Diagnosis not present

## 2016-01-04 DIAGNOSIS — R32 Unspecified urinary incontinence: Secondary | ICD-10-CM | POA: Diagnosis not present

## 2016-01-04 DIAGNOSIS — I10 Essential (primary) hypertension: Secondary | ICD-10-CM | POA: Diagnosis not present

## 2016-01-04 DIAGNOSIS — R634 Abnormal weight loss: Secondary | ICD-10-CM | POA: Diagnosis not present

## 2016-01-04 DIAGNOSIS — R2681 Unsteadiness on feet: Secondary | ICD-10-CM | POA: Diagnosis not present

## 2016-01-04 DIAGNOSIS — M6281 Muscle weakness (generalized): Secondary | ICD-10-CM | POA: Diagnosis not present

## 2016-01-04 DIAGNOSIS — R4701 Aphasia: Secondary | ICD-10-CM | POA: Diagnosis not present

## 2016-01-04 DIAGNOSIS — E119 Type 2 diabetes mellitus without complications: Secondary | ICD-10-CM | POA: Diagnosis not present

## 2016-01-05 DIAGNOSIS — M6281 Muscle weakness (generalized): Secondary | ICD-10-CM | POA: Diagnosis not present

## 2016-01-05 DIAGNOSIS — F0390 Unspecified dementia without behavioral disturbance: Secondary | ICD-10-CM | POA: Diagnosis not present

## 2016-01-05 DIAGNOSIS — R2681 Unsteadiness on feet: Secondary | ICD-10-CM | POA: Diagnosis not present

## 2016-01-05 DIAGNOSIS — R41841 Cognitive communication deficit: Secondary | ICD-10-CM | POA: Diagnosis not present

## 2016-01-05 DIAGNOSIS — Z9181 History of falling: Secondary | ICD-10-CM | POA: Diagnosis not present

## 2016-01-05 DIAGNOSIS — R4189 Other symptoms and signs involving cognitive functions and awareness: Secondary | ICD-10-CM | POA: Diagnosis not present

## 2016-01-06 DIAGNOSIS — M6281 Muscle weakness (generalized): Secondary | ICD-10-CM | POA: Diagnosis not present

## 2016-01-06 DIAGNOSIS — R2681 Unsteadiness on feet: Secondary | ICD-10-CM | POA: Diagnosis not present

## 2016-01-06 DIAGNOSIS — Z9181 History of falling: Secondary | ICD-10-CM | POA: Diagnosis not present

## 2016-01-06 DIAGNOSIS — R4189 Other symptoms and signs involving cognitive functions and awareness: Secondary | ICD-10-CM | POA: Diagnosis not present

## 2016-01-06 DIAGNOSIS — F0390 Unspecified dementia without behavioral disturbance: Secondary | ICD-10-CM | POA: Diagnosis not present

## 2016-01-06 DIAGNOSIS — R41841 Cognitive communication deficit: Secondary | ICD-10-CM | POA: Diagnosis not present

## 2016-01-07 DIAGNOSIS — F0390 Unspecified dementia without behavioral disturbance: Secondary | ICD-10-CM | POA: Diagnosis not present

## 2016-01-07 DIAGNOSIS — R4189 Other symptoms and signs involving cognitive functions and awareness: Secondary | ICD-10-CM | POA: Diagnosis not present

## 2016-01-07 DIAGNOSIS — M6281 Muscle weakness (generalized): Secondary | ICD-10-CM | POA: Diagnosis not present

## 2016-01-07 DIAGNOSIS — R4701 Aphasia: Secondary | ICD-10-CM | POA: Diagnosis not present

## 2016-01-07 DIAGNOSIS — R41841 Cognitive communication deficit: Secondary | ICD-10-CM | POA: Diagnosis not present

## 2016-01-07 DIAGNOSIS — Z9181 History of falling: Secondary | ICD-10-CM | POA: Diagnosis not present

## 2016-01-07 DIAGNOSIS — R2681 Unsteadiness on feet: Secondary | ICD-10-CM | POA: Diagnosis not present

## 2016-01-10 DIAGNOSIS — R2681 Unsteadiness on feet: Secondary | ICD-10-CM | POA: Diagnosis not present

## 2016-01-10 DIAGNOSIS — R4189 Other symptoms and signs involving cognitive functions and awareness: Secondary | ICD-10-CM | POA: Diagnosis not present

## 2016-01-10 DIAGNOSIS — M6281 Muscle weakness (generalized): Secondary | ICD-10-CM | POA: Diagnosis not present

## 2016-01-10 DIAGNOSIS — Z9181 History of falling: Secondary | ICD-10-CM | POA: Diagnosis not present

## 2016-01-10 DIAGNOSIS — R41841 Cognitive communication deficit: Secondary | ICD-10-CM | POA: Diagnosis not present

## 2016-01-10 DIAGNOSIS — F0390 Unspecified dementia without behavioral disturbance: Secondary | ICD-10-CM | POA: Diagnosis not present

## 2016-01-11 DIAGNOSIS — R4189 Other symptoms and signs involving cognitive functions and awareness: Secondary | ICD-10-CM | POA: Diagnosis not present

## 2016-01-11 DIAGNOSIS — Z9181 History of falling: Secondary | ICD-10-CM | POA: Diagnosis not present

## 2016-01-11 DIAGNOSIS — F0390 Unspecified dementia without behavioral disturbance: Secondary | ICD-10-CM | POA: Diagnosis not present

## 2016-01-11 DIAGNOSIS — R2681 Unsteadiness on feet: Secondary | ICD-10-CM | POA: Diagnosis not present

## 2016-01-11 DIAGNOSIS — M6281 Muscle weakness (generalized): Secondary | ICD-10-CM | POA: Diagnosis not present

## 2016-01-11 DIAGNOSIS — R41841 Cognitive communication deficit: Secondary | ICD-10-CM | POA: Diagnosis not present

## 2016-01-12 DIAGNOSIS — R41841 Cognitive communication deficit: Secondary | ICD-10-CM | POA: Diagnosis not present

## 2016-01-12 DIAGNOSIS — M6281 Muscle weakness (generalized): Secondary | ICD-10-CM | POA: Diagnosis not present

## 2016-01-12 DIAGNOSIS — F0390 Unspecified dementia without behavioral disturbance: Secondary | ICD-10-CM | POA: Diagnosis not present

## 2016-01-12 DIAGNOSIS — R2681 Unsteadiness on feet: Secondary | ICD-10-CM | POA: Diagnosis not present

## 2016-01-12 DIAGNOSIS — Z9181 History of falling: Secondary | ICD-10-CM | POA: Diagnosis not present

## 2016-01-12 DIAGNOSIS — R4189 Other symptoms and signs involving cognitive functions and awareness: Secondary | ICD-10-CM | POA: Diagnosis not present

## 2016-01-13 DIAGNOSIS — Z9181 History of falling: Secondary | ICD-10-CM | POA: Diagnosis not present

## 2016-01-13 DIAGNOSIS — R2681 Unsteadiness on feet: Secondary | ICD-10-CM | POA: Diagnosis not present

## 2016-01-13 DIAGNOSIS — R4189 Other symptoms and signs involving cognitive functions and awareness: Secondary | ICD-10-CM | POA: Diagnosis not present

## 2016-01-13 DIAGNOSIS — F0390 Unspecified dementia without behavioral disturbance: Secondary | ICD-10-CM | POA: Diagnosis not present

## 2016-01-13 DIAGNOSIS — M6281 Muscle weakness (generalized): Secondary | ICD-10-CM | POA: Diagnosis not present

## 2016-01-13 DIAGNOSIS — R41841 Cognitive communication deficit: Secondary | ICD-10-CM | POA: Diagnosis not present

## 2016-01-14 DIAGNOSIS — F0390 Unspecified dementia without behavioral disturbance: Secondary | ICD-10-CM | POA: Diagnosis not present

## 2016-01-14 DIAGNOSIS — R41841 Cognitive communication deficit: Secondary | ICD-10-CM | POA: Diagnosis not present

## 2016-01-14 DIAGNOSIS — M6281 Muscle weakness (generalized): Secondary | ICD-10-CM | POA: Diagnosis not present

## 2016-01-14 DIAGNOSIS — R2681 Unsteadiness on feet: Secondary | ICD-10-CM | POA: Diagnosis not present

## 2016-01-14 DIAGNOSIS — R4189 Other symptoms and signs involving cognitive functions and awareness: Secondary | ICD-10-CM | POA: Diagnosis not present

## 2016-01-14 DIAGNOSIS — Z9181 History of falling: Secondary | ICD-10-CM | POA: Diagnosis not present

## 2016-01-17 DIAGNOSIS — R4189 Other symptoms and signs involving cognitive functions and awareness: Secondary | ICD-10-CM | POA: Diagnosis not present

## 2016-01-17 DIAGNOSIS — R41841 Cognitive communication deficit: Secondary | ICD-10-CM | POA: Diagnosis not present

## 2016-01-17 DIAGNOSIS — F0390 Unspecified dementia without behavioral disturbance: Secondary | ICD-10-CM | POA: Diagnosis not present

## 2016-01-17 DIAGNOSIS — M6281 Muscle weakness (generalized): Secondary | ICD-10-CM | POA: Diagnosis not present

## 2016-01-17 DIAGNOSIS — Z9181 History of falling: Secondary | ICD-10-CM | POA: Diagnosis not present

## 2016-01-17 DIAGNOSIS — R2681 Unsteadiness on feet: Secondary | ICD-10-CM | POA: Diagnosis not present

## 2016-01-18 DIAGNOSIS — M6281 Muscle weakness (generalized): Secondary | ICD-10-CM | POA: Diagnosis not present

## 2016-01-18 DIAGNOSIS — F0390 Unspecified dementia without behavioral disturbance: Secondary | ICD-10-CM | POA: Diagnosis not present

## 2016-01-18 DIAGNOSIS — Z9181 History of falling: Secondary | ICD-10-CM | POA: Diagnosis not present

## 2016-01-18 DIAGNOSIS — R2681 Unsteadiness on feet: Secondary | ICD-10-CM | POA: Diagnosis not present

## 2016-01-18 DIAGNOSIS — R41841 Cognitive communication deficit: Secondary | ICD-10-CM | POA: Diagnosis not present

## 2016-01-18 DIAGNOSIS — R4189 Other symptoms and signs involving cognitive functions and awareness: Secondary | ICD-10-CM | POA: Diagnosis not present

## 2016-01-19 DIAGNOSIS — R4189 Other symptoms and signs involving cognitive functions and awareness: Secondary | ICD-10-CM | POA: Diagnosis not present

## 2016-01-19 DIAGNOSIS — R41841 Cognitive communication deficit: Secondary | ICD-10-CM | POA: Diagnosis not present

## 2016-01-19 DIAGNOSIS — R2681 Unsteadiness on feet: Secondary | ICD-10-CM | POA: Diagnosis not present

## 2016-01-19 DIAGNOSIS — Z9181 History of falling: Secondary | ICD-10-CM | POA: Diagnosis not present

## 2016-01-19 DIAGNOSIS — F0390 Unspecified dementia without behavioral disturbance: Secondary | ICD-10-CM | POA: Diagnosis not present

## 2016-01-19 DIAGNOSIS — M6281 Muscle weakness (generalized): Secondary | ICD-10-CM | POA: Diagnosis not present

## 2016-01-20 DIAGNOSIS — Z794 Long term (current) use of insulin: Secondary | ICD-10-CM | POA: Diagnosis not present

## 2016-01-20 DIAGNOSIS — F0390 Unspecified dementia without behavioral disturbance: Secondary | ICD-10-CM | POA: Diagnosis not present

## 2016-01-20 DIAGNOSIS — R41841 Cognitive communication deficit: Secondary | ICD-10-CM | POA: Diagnosis not present

## 2016-01-20 DIAGNOSIS — E1165 Type 2 diabetes mellitus with hyperglycemia: Secondary | ICD-10-CM | POA: Diagnosis not present

## 2016-01-20 DIAGNOSIS — R4189 Other symptoms and signs involving cognitive functions and awareness: Secondary | ICD-10-CM | POA: Diagnosis not present

## 2016-01-20 DIAGNOSIS — R2681 Unsteadiness on feet: Secondary | ICD-10-CM | POA: Diagnosis not present

## 2016-01-20 DIAGNOSIS — M6281 Muscle weakness (generalized): Secondary | ICD-10-CM | POA: Diagnosis not present

## 2016-01-20 DIAGNOSIS — E1142 Type 2 diabetes mellitus with diabetic polyneuropathy: Secondary | ICD-10-CM | POA: Diagnosis not present

## 2016-01-20 DIAGNOSIS — Z9181 History of falling: Secondary | ICD-10-CM | POA: Diagnosis not present

## 2016-01-21 DIAGNOSIS — R2681 Unsteadiness on feet: Secondary | ICD-10-CM | POA: Diagnosis not present

## 2016-01-21 DIAGNOSIS — F0390 Unspecified dementia without behavioral disturbance: Secondary | ICD-10-CM | POA: Diagnosis not present

## 2016-01-21 DIAGNOSIS — R41841 Cognitive communication deficit: Secondary | ICD-10-CM | POA: Diagnosis not present

## 2016-01-21 DIAGNOSIS — Z9181 History of falling: Secondary | ICD-10-CM | POA: Diagnosis not present

## 2016-01-21 DIAGNOSIS — M6281 Muscle weakness (generalized): Secondary | ICD-10-CM | POA: Diagnosis not present

## 2016-01-21 DIAGNOSIS — R4189 Other symptoms and signs involving cognitive functions and awareness: Secondary | ICD-10-CM | POA: Diagnosis not present

## 2016-01-24 DIAGNOSIS — M6281 Muscle weakness (generalized): Secondary | ICD-10-CM | POA: Diagnosis not present

## 2016-01-24 DIAGNOSIS — R4189 Other symptoms and signs involving cognitive functions and awareness: Secondary | ICD-10-CM | POA: Diagnosis not present

## 2016-01-24 DIAGNOSIS — R41841 Cognitive communication deficit: Secondary | ICD-10-CM | POA: Diagnosis not present

## 2016-01-24 DIAGNOSIS — Z9181 History of falling: Secondary | ICD-10-CM | POA: Diagnosis not present

## 2016-01-24 DIAGNOSIS — R2681 Unsteadiness on feet: Secondary | ICD-10-CM | POA: Diagnosis not present

## 2016-01-24 DIAGNOSIS — F0390 Unspecified dementia without behavioral disturbance: Secondary | ICD-10-CM | POA: Diagnosis not present

## 2016-01-25 DIAGNOSIS — R2681 Unsteadiness on feet: Secondary | ICD-10-CM | POA: Diagnosis not present

## 2016-01-25 DIAGNOSIS — R4189 Other symptoms and signs involving cognitive functions and awareness: Secondary | ICD-10-CM | POA: Diagnosis not present

## 2016-01-25 DIAGNOSIS — R41841 Cognitive communication deficit: Secondary | ICD-10-CM | POA: Diagnosis not present

## 2016-01-25 DIAGNOSIS — F0390 Unspecified dementia without behavioral disturbance: Secondary | ICD-10-CM | POA: Diagnosis not present

## 2016-01-25 DIAGNOSIS — M6281 Muscle weakness (generalized): Secondary | ICD-10-CM | POA: Diagnosis not present

## 2016-01-25 DIAGNOSIS — Z9181 History of falling: Secondary | ICD-10-CM | POA: Diagnosis not present

## 2016-01-26 DIAGNOSIS — F0390 Unspecified dementia without behavioral disturbance: Secondary | ICD-10-CM | POA: Diagnosis not present

## 2016-01-26 DIAGNOSIS — R4189 Other symptoms and signs involving cognitive functions and awareness: Secondary | ICD-10-CM | POA: Diagnosis not present

## 2016-01-26 DIAGNOSIS — M6281 Muscle weakness (generalized): Secondary | ICD-10-CM | POA: Diagnosis not present

## 2016-01-26 DIAGNOSIS — Z9181 History of falling: Secondary | ICD-10-CM | POA: Diagnosis not present

## 2016-01-26 DIAGNOSIS — R41841 Cognitive communication deficit: Secondary | ICD-10-CM | POA: Diagnosis not present

## 2016-01-26 DIAGNOSIS — R2681 Unsteadiness on feet: Secondary | ICD-10-CM | POA: Diagnosis not present

## 2016-01-27 DIAGNOSIS — R4189 Other symptoms and signs involving cognitive functions and awareness: Secondary | ICD-10-CM | POA: Diagnosis not present

## 2016-01-27 DIAGNOSIS — Z9181 History of falling: Secondary | ICD-10-CM | POA: Diagnosis not present

## 2016-01-27 DIAGNOSIS — M6281 Muscle weakness (generalized): Secondary | ICD-10-CM | POA: Diagnosis not present

## 2016-01-27 DIAGNOSIS — F0390 Unspecified dementia without behavioral disturbance: Secondary | ICD-10-CM | POA: Diagnosis not present

## 2016-01-27 DIAGNOSIS — R2681 Unsteadiness on feet: Secondary | ICD-10-CM | POA: Diagnosis not present

## 2016-01-27 DIAGNOSIS — R41841 Cognitive communication deficit: Secondary | ICD-10-CM | POA: Diagnosis not present

## 2016-01-28 DIAGNOSIS — R41841 Cognitive communication deficit: Secondary | ICD-10-CM | POA: Diagnosis not present

## 2016-01-28 DIAGNOSIS — R2681 Unsteadiness on feet: Secondary | ICD-10-CM | POA: Diagnosis not present

## 2016-01-28 DIAGNOSIS — F0390 Unspecified dementia without behavioral disturbance: Secondary | ICD-10-CM | POA: Diagnosis not present

## 2016-01-28 DIAGNOSIS — Z9181 History of falling: Secondary | ICD-10-CM | POA: Diagnosis not present

## 2016-01-28 DIAGNOSIS — M6281 Muscle weakness (generalized): Secondary | ICD-10-CM | POA: Diagnosis not present

## 2016-01-28 DIAGNOSIS — R4189 Other symptoms and signs involving cognitive functions and awareness: Secondary | ICD-10-CM | POA: Diagnosis not present

## 2016-01-31 DIAGNOSIS — R41841 Cognitive communication deficit: Secondary | ICD-10-CM | POA: Diagnosis not present

## 2016-01-31 DIAGNOSIS — F0390 Unspecified dementia without behavioral disturbance: Secondary | ICD-10-CM | POA: Diagnosis not present

## 2016-01-31 DIAGNOSIS — R4189 Other symptoms and signs involving cognitive functions and awareness: Secondary | ICD-10-CM | POA: Diagnosis not present

## 2016-01-31 DIAGNOSIS — M6281 Muscle weakness (generalized): Secondary | ICD-10-CM | POA: Diagnosis not present

## 2016-01-31 DIAGNOSIS — R2681 Unsteadiness on feet: Secondary | ICD-10-CM | POA: Diagnosis not present

## 2016-01-31 DIAGNOSIS — Z9181 History of falling: Secondary | ICD-10-CM | POA: Diagnosis not present

## 2016-02-01 ENCOUNTER — Ambulatory Visit (INDEPENDENT_AMBULATORY_CARE_PROVIDER_SITE_OTHER): Payer: Medicare Other | Admitting: Podiatry

## 2016-02-01 DIAGNOSIS — F0390 Unspecified dementia without behavioral disturbance: Secondary | ICD-10-CM | POA: Diagnosis not present

## 2016-02-01 DIAGNOSIS — E1151 Type 2 diabetes mellitus with diabetic peripheral angiopathy without gangrene: Secondary | ICD-10-CM | POA: Diagnosis not present

## 2016-02-01 DIAGNOSIS — B351 Tinea unguium: Secondary | ICD-10-CM

## 2016-02-01 DIAGNOSIS — M6281 Muscle weakness (generalized): Secondary | ICD-10-CM | POA: Diagnosis not present

## 2016-02-01 DIAGNOSIS — M79676 Pain in unspecified toe(s): Secondary | ICD-10-CM | POA: Diagnosis not present

## 2016-02-01 DIAGNOSIS — R2681 Unsteadiness on feet: Secondary | ICD-10-CM | POA: Diagnosis not present

## 2016-02-01 DIAGNOSIS — R4189 Other symptoms and signs involving cognitive functions and awareness: Secondary | ICD-10-CM | POA: Diagnosis not present

## 2016-02-01 DIAGNOSIS — R41841 Cognitive communication deficit: Secondary | ICD-10-CM | POA: Diagnosis not present

## 2016-02-01 DIAGNOSIS — Z9181 History of falling: Secondary | ICD-10-CM | POA: Diagnosis not present

## 2016-02-02 DIAGNOSIS — F0390 Unspecified dementia without behavioral disturbance: Secondary | ICD-10-CM | POA: Diagnosis not present

## 2016-02-02 DIAGNOSIS — R2681 Unsteadiness on feet: Secondary | ICD-10-CM | POA: Diagnosis not present

## 2016-02-02 DIAGNOSIS — R41841 Cognitive communication deficit: Secondary | ICD-10-CM | POA: Diagnosis not present

## 2016-02-02 DIAGNOSIS — M6281 Muscle weakness (generalized): Secondary | ICD-10-CM | POA: Diagnosis not present

## 2016-02-02 DIAGNOSIS — Z9181 History of falling: Secondary | ICD-10-CM | POA: Diagnosis not present

## 2016-02-02 DIAGNOSIS — R4189 Other symptoms and signs involving cognitive functions and awareness: Secondary | ICD-10-CM | POA: Diagnosis not present

## 2016-02-02 NOTE — Progress Notes (Signed)
Subjective: 82 y.o. returns the office today for painful, elongated, thickened toenails which he cannot trim himself. Denies any redness or drainage around the nails. The nails get ingrown at times as well.  Denies any acute changes since last appointment and no new complaints today. Denies any systemic complaints such as fevers, chills, nausea, vomiting.   Objective: AAO 3, NAD DP/PT pulses palpable, CRT less than 3 seconds Nails hypertrophic, dystrophic, elongated, brittle, discolored 10. There is tenderness overlying the nails 1-5 bilaterally. There is no surrounding erythema or drainage along the nail sites. No open lesions or pre-ulcerative lesions are identified. No other areas of tenderness bilateral lower extremities. No overlying edema, erythema, increased warmth. No pain with calf compression, swelling, warmth, erythema.  Assessment: Patient presents with symptomatic onychomycosis  Plan: -Treatment options including alternatives, risks, complications were discussed -Nails sharply debrided 10 without complication/bleeding. -Discussed daily foot inspection. If there are any changes, to call the office immediately.  -Follow-up in 3 months or sooner if any problems are to arise. In the meantime, encouraged to call the office with any questions, concerns, changes symptoms.  Celesta Gentile, DPM

## 2016-02-03 DIAGNOSIS — F0390 Unspecified dementia without behavioral disturbance: Secondary | ICD-10-CM | POA: Diagnosis not present

## 2016-02-03 DIAGNOSIS — M6281 Muscle weakness (generalized): Secondary | ICD-10-CM | POA: Diagnosis not present

## 2016-02-03 DIAGNOSIS — R41841 Cognitive communication deficit: Secondary | ICD-10-CM | POA: Diagnosis not present

## 2016-02-03 DIAGNOSIS — Z9181 History of falling: Secondary | ICD-10-CM | POA: Diagnosis not present

## 2016-02-03 DIAGNOSIS — R4189 Other symptoms and signs involving cognitive functions and awareness: Secondary | ICD-10-CM | POA: Diagnosis not present

## 2016-02-03 DIAGNOSIS — R2681 Unsteadiness on feet: Secondary | ICD-10-CM | POA: Diagnosis not present

## 2016-02-04 DIAGNOSIS — Z9181 History of falling: Secondary | ICD-10-CM | POA: Diagnosis not present

## 2016-02-04 DIAGNOSIS — M6281 Muscle weakness (generalized): Secondary | ICD-10-CM | POA: Diagnosis not present

## 2016-02-04 DIAGNOSIS — R2681 Unsteadiness on feet: Secondary | ICD-10-CM | POA: Diagnosis not present

## 2016-02-04 DIAGNOSIS — F0390 Unspecified dementia without behavioral disturbance: Secondary | ICD-10-CM | POA: Diagnosis not present

## 2016-02-04 DIAGNOSIS — R4189 Other symptoms and signs involving cognitive functions and awareness: Secondary | ICD-10-CM | POA: Diagnosis not present

## 2016-02-04 DIAGNOSIS — R41841 Cognitive communication deficit: Secondary | ICD-10-CM | POA: Diagnosis not present

## 2016-02-07 DIAGNOSIS — F0390 Unspecified dementia without behavioral disturbance: Secondary | ICD-10-CM | POA: Diagnosis not present

## 2016-02-07 DIAGNOSIS — Z9181 History of falling: Secondary | ICD-10-CM | POA: Diagnosis not present

## 2016-02-07 DIAGNOSIS — R41841 Cognitive communication deficit: Secondary | ICD-10-CM | POA: Diagnosis not present

## 2016-02-07 DIAGNOSIS — R4701 Aphasia: Secondary | ICD-10-CM | POA: Diagnosis not present

## 2016-02-08 DIAGNOSIS — F0390 Unspecified dementia without behavioral disturbance: Secondary | ICD-10-CM | POA: Diagnosis not present

## 2016-02-08 DIAGNOSIS — R4701 Aphasia: Secondary | ICD-10-CM | POA: Diagnosis not present

## 2016-02-08 DIAGNOSIS — R41841 Cognitive communication deficit: Secondary | ICD-10-CM | POA: Diagnosis not present

## 2016-02-08 DIAGNOSIS — Z9181 History of falling: Secondary | ICD-10-CM | POA: Diagnosis not present

## 2016-02-11 DIAGNOSIS — Z23 Encounter for immunization: Secondary | ICD-10-CM | POA: Diagnosis not present

## 2016-02-23 DIAGNOSIS — Z23 Encounter for immunization: Secondary | ICD-10-CM | POA: Diagnosis not present

## 2016-03-01 DIAGNOSIS — E1142 Type 2 diabetes mellitus with diabetic polyneuropathy: Secondary | ICD-10-CM | POA: Diagnosis not present

## 2016-03-01 DIAGNOSIS — E1165 Type 2 diabetes mellitus with hyperglycemia: Secondary | ICD-10-CM | POA: Diagnosis not present

## 2016-03-01 DIAGNOSIS — Z794 Long term (current) use of insulin: Secondary | ICD-10-CM | POA: Diagnosis not present

## 2016-03-14 DIAGNOSIS — F0390 Unspecified dementia without behavioral disturbance: Secondary | ICD-10-CM | POA: Diagnosis not present

## 2016-03-14 DIAGNOSIS — E785 Hyperlipidemia, unspecified: Secondary | ICD-10-CM | POA: Diagnosis not present

## 2016-03-14 DIAGNOSIS — I10 Essential (primary) hypertension: Secondary | ICD-10-CM | POA: Diagnosis not present

## 2016-03-14 DIAGNOSIS — E119 Type 2 diabetes mellitus without complications: Secondary | ICD-10-CM | POA: Diagnosis not present

## 2016-03-20 DIAGNOSIS — R21 Rash and other nonspecific skin eruption: Secondary | ICD-10-CM | POA: Diagnosis not present

## 2016-03-20 DIAGNOSIS — C61 Malignant neoplasm of prostate: Secondary | ICD-10-CM | POA: Diagnosis not present

## 2016-03-22 DIAGNOSIS — D090 Carcinoma in situ of bladder: Secondary | ICD-10-CM | POA: Diagnosis not present

## 2016-03-22 DIAGNOSIS — Z8546 Personal history of malignant neoplasm of prostate: Secondary | ICD-10-CM | POA: Diagnosis not present

## 2016-03-30 DIAGNOSIS — N390 Urinary tract infection, site not specified: Secondary | ICD-10-CM | POA: Diagnosis not present

## 2016-04-05 DIAGNOSIS — D485 Neoplasm of uncertain behavior of skin: Secondary | ICD-10-CM | POA: Diagnosis not present

## 2016-04-05 DIAGNOSIS — L570 Actinic keratosis: Secondary | ICD-10-CM | POA: Diagnosis not present

## 2016-04-05 DIAGNOSIS — X32XXXD Exposure to sunlight, subsequent encounter: Secondary | ICD-10-CM | POA: Diagnosis not present

## 2016-04-14 ENCOUNTER — Emergency Department (HOSPITAL_COMMUNITY): Payer: Medicare Other

## 2016-04-14 ENCOUNTER — Observation Stay (HOSPITAL_COMMUNITY)
Admission: EM | Admit: 2016-04-14 | Discharge: 2016-04-15 | Disposition: A | Discharge status: Home / Self Care | Payer: Medicare Other | Attending: Internal Medicine | Admitting: Internal Medicine

## 2016-04-14 ENCOUNTER — Encounter (HOSPITAL_COMMUNITY): Payer: Self-pay | Admitting: Radiology

## 2016-04-14 ENCOUNTER — Observation Stay (HOSPITAL_COMMUNITY): Payer: Medicare Other

## 2016-04-14 DIAGNOSIS — D320 Benign neoplasm of cerebral meninges: Secondary | ICD-10-CM | POA: Diagnosis not present

## 2016-04-14 DIAGNOSIS — R402431 Glasgow coma scale score 3-8, in the field [EMT or ambulance]: Secondary | ICD-10-CM | POA: Diagnosis not present

## 2016-04-14 DIAGNOSIS — E1165 Type 2 diabetes mellitus with hyperglycemia: Secondary | ICD-10-CM | POA: Insufficient documentation

## 2016-04-14 DIAGNOSIS — R9082 White matter disease, unspecified: Secondary | ICD-10-CM | POA: Insufficient documentation

## 2016-04-14 DIAGNOSIS — F0390 Unspecified dementia without behavioral disturbance: Secondary | ICD-10-CM | POA: Diagnosis not present

## 2016-04-14 DIAGNOSIS — Z9119 Patient's noncompliance with other medical treatment and regimen: Secondary | ICD-10-CM | POA: Diagnosis not present

## 2016-04-14 DIAGNOSIS — I10 Essential (primary) hypertension: Secondary | ICD-10-CM | POA: Diagnosis not present

## 2016-04-14 DIAGNOSIS — Z7902 Long term (current) use of antithrombotics/antiplatelets: Secondary | ICD-10-CM | POA: Insufficient documentation

## 2016-04-14 DIAGNOSIS — R29818 Other symptoms and signs involving the nervous system: Secondary | ICD-10-CM | POA: Diagnosis not present

## 2016-04-14 DIAGNOSIS — Z7982 Long term (current) use of aspirin: Secondary | ICD-10-CM | POA: Diagnosis not present

## 2016-04-14 DIAGNOSIS — Z823 Family history of stroke: Secondary | ICD-10-CM | POA: Diagnosis not present

## 2016-04-14 DIAGNOSIS — R4701 Aphasia: Secondary | ICD-10-CM

## 2016-04-14 DIAGNOSIS — G3189 Other specified degenerative diseases of nervous system: Secondary | ICD-10-CM | POA: Insufficient documentation

## 2016-04-14 DIAGNOSIS — Z888 Allergy status to other drugs, medicaments and biological substances status: Secondary | ICD-10-CM | POA: Insufficient documentation

## 2016-04-14 DIAGNOSIS — Z794 Long term (current) use of insulin: Secondary | ICD-10-CM | POA: Diagnosis not present

## 2016-04-14 DIAGNOSIS — Z8551 Personal history of malignant neoplasm of bladder: Secondary | ICD-10-CM | POA: Insufficient documentation

## 2016-04-14 DIAGNOSIS — G459 Transient cerebral ischemic attack, unspecified: Secondary | ICD-10-CM | POA: Diagnosis not present

## 2016-04-14 DIAGNOSIS — Z8546 Personal history of malignant neoplasm of prostate: Secondary | ICD-10-CM | POA: Diagnosis not present

## 2016-04-14 DIAGNOSIS — E785 Hyperlipidemia, unspecified: Secondary | ICD-10-CM | POA: Insufficient documentation

## 2016-04-14 DIAGNOSIS — M810 Age-related osteoporosis without current pathological fracture: Secondary | ICD-10-CM | POA: Diagnosis not present

## 2016-04-14 HISTORY — DX: Malignant neoplasm of prostate: C61

## 2016-04-14 LAB — DIFFERENTIAL
BASOS ABS: 0 10*3/uL (ref 0.0–0.1)
Basophils Relative: 0 %
EOS PCT: 4 %
Eosinophils Absolute: 0.3 10*3/uL (ref 0.0–0.7)
LYMPHS PCT: 30 %
Lymphs Abs: 1.9 10*3/uL (ref 0.7–4.0)
Monocytes Absolute: 0.6 10*3/uL (ref 0.1–1.0)
Monocytes Relative: 9 %
NEUTROS ABS: 3.6 10*3/uL (ref 1.7–7.7)
NEUTROS PCT: 57 %

## 2016-04-14 LAB — LIPID PANEL
CHOLESTEROL: 128 mg/dL (ref 0–200)
HDL: 56 mg/dL (ref >40)
LDL Cholesterol: 60 mg/dL (ref 0–99)
TRIGLYCERIDES: 60 mg/dL (ref <150)
Total CHOL/HDL Ratio: 2.3 RATIO
VLDL: 12 mg/dL (ref 0–40)

## 2016-04-14 LAB — I-STAT CHEM 8, ED
BUN: 18 mg/dL (ref 6–20)
Calcium, Ion: 1.2 mmol/L (ref 1.15–1.40)
Chloride: 103 mmol/L (ref 101–111)
Creatinine, Ser: 1 mg/dL (ref 0.61–1.24)
Glucose, Bld: 190 mg/dL — ABNORMAL HIGH (ref 65–99)
HEMATOCRIT: 38 % — AB (ref 39.0–52.0)
HEMOGLOBIN: 12.9 g/dL — AB (ref 13.0–17.0)
Potassium: 3.9 mmol/L (ref 3.5–5.1)
SODIUM: 140 mmol/L (ref 135–145)
TCO2: 25 mmol/L (ref 0–100)

## 2016-04-14 LAB — CBC
HCT: 38.3 % — ABNORMAL LOW (ref 39.0–52.0)
Hemoglobin: 12.8 g/dL — ABNORMAL LOW (ref 13.0–17.0)
MCH: 31.1 pg (ref 26.0–34.0)
MCHC: 33.4 g/dL (ref 30.0–36.0)
MCV: 93 fL (ref 78.0–100.0)
PLATELETS: 210 10*3/uL (ref 150–400)
RBC: 4.12 MIL/uL — AB (ref 4.22–5.81)
RDW: 13.2 % (ref 11.5–15.5)
WBC: 6.4 10*3/uL (ref 4.0–10.5)

## 2016-04-14 LAB — COMPREHENSIVE METABOLIC PANEL
ALBUMIN: 3.8 g/dL (ref 3.5–5.0)
ALK PHOS: 41 U/L (ref 38–126)
ALT: 37 U/L (ref 17–63)
ANION GAP: 7 (ref 5–15)
AST: 27 U/L (ref 15–41)
BUN: 16 mg/dL (ref 6–20)
CALCIUM: 9.2 mg/dL (ref 8.9–10.3)
CO2: 26 mmol/L (ref 22–32)
Chloride: 106 mmol/L (ref 101–111)
Creatinine, Ser: 0.96 mg/dL (ref 0.61–1.24)
GFR calc Af Amer: >60 mL/min (ref >60)
GFR calc non Af Amer: >60 mL/min (ref >60)
GLUCOSE: 198 mg/dL — AB (ref 65–99)
Potassium: 4 mmol/L (ref 3.5–5.1)
SODIUM: 139 mmol/L (ref 135–145)
Total Bilirubin: 0.7 mg/dL (ref 0.3–1.2)
Total Protein: 6.2 g/dL — ABNORMAL LOW (ref 6.5–8.1)

## 2016-04-14 LAB — CBG MONITORING, ED
GLUCOSE-CAPILLARY: 151 mg/dL — AB (ref 65–99)
GLUCOSE-CAPILLARY: 180 mg/dL — AB (ref 65–99)

## 2016-04-14 LAB — GLUCOSE, CAPILLARY
GLUCOSE-CAPILLARY: 236 mg/dL — AB (ref 65–99)
Glucose-Capillary: 229 mg/dL — ABNORMAL HIGH (ref 65–99)

## 2016-04-14 LAB — APTT: APTT: 28 s (ref 24–36)

## 2016-04-14 LAB — I-STAT TROPONIN, ED: Troponin i, poc: 0 ng/mL (ref 0.00–0.08)

## 2016-04-14 LAB — PROTIME-INR
INR: 1.02
PROTHROMBIN TIME: 13.4 s (ref 11.4–15.2)

## 2016-04-14 MED ORDER — ASPIRIN EC 81 MG PO TBEC
81.0000 mg | DELAYED_RELEASE_TABLET | Freq: Every day | ORAL | Status: DC
  Administered 2016-04-15: 81 mg via ORAL
  Filled 2016-04-14: qty 1

## 2016-04-14 MED ORDER — INSULIN GLARGINE 100 UNIT/ML ~~LOC~~ SOLN
22.0000 [IU] | Freq: Every day | SUBCUTANEOUS | Status: DC
  Administered 2016-04-15: 22 [IU] via SUBCUTANEOUS
  Filled 2016-04-14 (×2): qty 0.22

## 2016-04-14 MED ORDER — VITAMIN D 1000 UNITS PO TABS
1000.0000 [IU] | ORAL_TABLET | Freq: Every day | ORAL | Status: DC
  Administered 2016-04-14 – 2016-04-15 (×2): 1000 [IU] via ORAL
  Filled 2016-04-14 (×2): qty 1

## 2016-04-14 MED ORDER — SODIUM CHLORIDE 0.9% FLUSH
3.0000 mL | Freq: Two times a day (BID) | INTRAVENOUS | Status: DC
Start: 2016-04-14 — End: 2016-04-15
  Administered 2016-04-15: 3 mL via INTRAVENOUS

## 2016-04-14 MED ORDER — STROKE: EARLY STAGES OF RECOVERY BOOK
Freq: Once | Status: DC
  Filled 2016-04-14: qty 1

## 2016-04-14 MED ORDER — INSULIN ASPART 100 UNIT/ML ~~LOC~~ SOLN: 0.0000 [IU] | Freq: Every day | SUBCUTANEOUS | Status: DC

## 2016-04-14 MED ORDER — NYSTATIN 100000 UNIT/GM EX CREA: 1.0000 "application " | TOPICAL_CREAM | Freq: Two times a day (BID) | CUTANEOUS | Status: DC | PRN

## 2016-04-14 MED ORDER — IOPAMIDOL (ISOVUE-370) INJECTION 76%
INTRAVENOUS | Status: AC
  Administered 2016-04-14: 50 mL
  Filled 2016-04-14: qty 50

## 2016-04-14 MED ORDER — ENOXAPARIN SODIUM 40 MG/0.4ML ~~LOC~~ SOLN
40.0000 mg | SUBCUTANEOUS | Status: DC
  Administered 2016-04-14: 40 mg via SUBCUTANEOUS
  Filled 2016-04-14: qty 0.4

## 2016-04-14 MED ORDER — INSULIN ASPART 100 UNIT/ML ~~LOC~~ SOLN
0.0000 [IU] | Freq: Three times a day (TID) | SUBCUTANEOUS | Status: DC
  Administered 2016-04-14: 3 [IU] via SUBCUTANEOUS
  Administered 2016-04-15: 5 [IU] via SUBCUTANEOUS
  Administered 2016-04-15: 2 [IU] via SUBCUTANEOUS

## 2016-04-14 MED ORDER — ATORVASTATIN CALCIUM 10 MG PO TABS
10.0000 mg | ORAL_TABLET | Freq: Every day | ORAL | Status: DC
  Administered 2016-04-14 – 2016-04-15 (×2): 10 mg via ORAL
  Filled 2016-04-14 (×2): qty 1

## 2016-04-14 MED ORDER — ASPIRIN 325 MG PO TABS
325.0000 mg | ORAL_TABLET | Freq: Once | ORAL | Status: AC
  Administered 2016-04-14: 325 mg via ORAL
  Filled 2016-04-14: qty 1

## 2016-04-14 MED ORDER — DONEPEZIL HCL 10 MG PO TABS
10.0000 mg | ORAL_TABLET | Freq: Every day | ORAL | Status: DC
  Administered 2016-04-15: 10 mg via ORAL
  Filled 2016-04-14: qty 1

## 2016-04-14 NOTE — Code Documentation (Signed)
82yo male arriving to [facility] via [place] at [phone].  Patient from nursing facility where he was seen by staff at 0730.  Staff said he was "groggy" but this was thought to be r/t just waking up.  Patient was noted at [place] to have right facial droop and right arm weakness and difficulty speaking.  EMS called and activated a code stroke d/t patient being nonverbal and only responding to pain.  Stroke team at the bedside on patient arrival.  Labs drawn and patient cleared for CT by Dr. Winfred Leeds.  Patient to CT with team.  CT completed.  NIHSS 8, see documentation for details and code stroke times.  Patient nonverbal except for minimal words intermittently and shakes head to questions.  CTA head and neck completed.  Patient to D36.  Exam repeated and NIHSS now 3, patient able to name all objects on card and describe picture reasonably well.  Stroke team spoke with family and staff of nursing home on the phone.  Unclear LKW and symptoms improving.  No acute stroke treatment at this time.  Patient to be admitted for stroke work-up.  Bedside handoff with ED RN Hassan Rowan.

## 2016-04-14 NOTE — ED Notes (Signed)
Ordered lunch tray 

## 2016-04-14 NOTE — ED Notes (Signed)
Pt mentation improving.  MD speaking with family.

## 2016-04-14 NOTE — ED Notes (Signed)
Patient transported to MRI 

## 2016-04-14 NOTE — H&P (Signed)
Date: 04/14/2016               Patient Name:  [name] MRN: [id number]  DOB: [birth year] Age / Sex: 82 y.o., male   PCP: Hulan Fess, MD         Medical Service: Internal Medicine Teaching Service         Attending Physician: Dr. Dareen Piano    First Contact: Dr. Philipp Ovens Pager: [phone]  Second Contact: Dr. Charlynn Grimes Pager: [phone]       After Hours (After 5p/  First Contact Pager: [phone]  weekends / holidays): Second Contact Pager: [phone]   Chief Complaint: Difficulty speaking  History of Present Illness: [name] is a 82yo man with PMHx of HTN, hyperlipidemia, type 2 DM, and dementia who presents today from [facility] home after he was unable to speak. Some of the history was obtained from the patient's wife as the patient has dementia. Wife reports she was called around 8AM this morning by the facility that her husband was unable to speak and he was confused. She reports being told he could move all of his extremities and follow directions but had trouble getting words out. Per EMS report, patient was seen by the nursing home staff around 7:30AM this morning and noted to be "groggy." Around 7:45AM he was noted to have a right facial droop, right arm weakness, and difficulty speaking. A code stroke was called by EMS and patient transported to the ED. His NIHSS was 8 on admission due to answering questions incorrectly but then on repeat was 3. Patient is able to speak currently and denies remembering the events of this morning. His wife feels his speech is slower than normal and that his mouth is moving differently. Wife states he apparently told staff he "felt funny" this morning. He denies changes in his vision, weakness, numbness/tingling, chest pain, and shortness of breath. Wife states he had right-sided hip bursitis diagnosed last year and has been doing PT at the nursing home. He is able to ambulate with a walker at baseline.   In the ED, CT head was negative for  acute stroke.     Meds:  Current Meds  Medication Sig  . acetaminophen (TYLENOL) 325 MG tablet Take 650 mg by mouth every 6 (six) hours as needed for mild pain or fever.  Marland Kitchen alendronate (FOSAMAX) 70 MG tablet Take 70 mg by mouth once a week. Take with a full glass of water on an empty stomach.  Marland Kitchen aspirin 81 MG tablet Take 81 mg by mouth daily.  Marland Kitchen atorvastatin (LIPITOR) 10 MG tablet Take 10 mg by mouth daily.  . cholecalciferol (VITAMIN D) 1000 units tablet Take 1,000 Units by mouth daily.  Marland Kitchen donepezil (ARICEPT) 10 MG tablet Take 10 mg by mouth at bedtime.  . insulin aspart (NOVOLOG) 100 UNIT/ML injection Inject 0-11 Units into the skin 3 (three) times daily before meals. Sliding scale  . insulin glargine (LANTUS) 100 UNIT/ML injection Inject 22 Units into the skin daily.  Marland Kitchen losartan (COZAAR) 25 MG tablet Take 25 mg by mouth daily.  Marland Kitchen nystatin cream (MYCOSTATIN) Apply 1 application topically 2 (two) times daily as needed for dry skin.     Allergies: Allergies as of 04/14/2016 - Review Complete 04/14/2016  Allergen Reaction Noted  . Actos [pioglitazone]  04/14/2016   Past Medical History:  Diagnosis Date  . Bladder cancer ([place])   . Dementia   . Diabetes mellitus without complication ([place])   .  Hyperlipidemia   . Hypertension   . Osteoporosis     Family History: Mother- CVA. Father- CVA.  Social History: Never smoker. No alcohol or illicit drug use. Married to wife for 8 years.  Review of Systems: A complete ROS was negative except as per HPI.   Physical Exam: Blood pressure 158/73, pulse 65, resp. rate 14, weight 151 lb 3.8 oz (68.6 kg), SpO2 98 %. General: elderly man sitting up in bed, NAD HEENT: [place]/AT, EOMI, PERRL, sclera anicteric, mucus membranes moist CV: RRR, no m/g/r Pulm: CTA bilaterally, breaths non-labored Abd: BS+, soft, non-tender Ext: warm, no peripheral edema  Neuro: alert and oriented to person, place, and month. Unable to tell me year. Right-sided facial  weakness. Tongue midline. Shoulder shrug symmetric and intact. Gag reflex present. Strength 5/5 in upper and lower extremities bilaterally. Sensation to light touch symmetric and intact.   EKG: Sinus rhythm. No significant changes from prior.   CT Head: No acute intracranial abnormality.  CT Angio Head/Neck:  1. Moderate tortuosity of the distal cervical internal carotid arteries without significant stenosis. This is most often related to chronic hypertension. 2. Minimal atherosclerotic changes at the left carotid bifurcation without significant stenosis. 3. Atherosclerotic calcifications involving the cavernous internal carotid arteries and vertebral arteries at the dural margins bilaterally. Moderate left vertebral artery narrowing is present. There are no other significant related stenoses at these levels. 4. Normal variant circle of Willis without significant proximal stenosis, aneurysm, or branch vessel occlusion. 5. Enhancing 2.1 cm meningioma adjacent to the right temporal tip.  MRI Brain: Pending   Assessment & Plan by Problem:  Likely TIA: Patient presented with a 1 day hx of difficulty speaking, right facial droop, and right arm weakness around 7:45AM. Although his last "normal" state is unknown. Since that time, his speech has returned and he has no weakness in his extremities on exam. He does have a slight right-sided facial droop. Given his symptoms were transient this most likely represents a TIA. CT head was negative for any acute process. Will check an MRI to rule out stroke. CT Angio head/neck did not show significant atherosclerotic disease. He has been on aspirin daily so can consider Plavix for him as antiplatelet therapy. Will verify with Neurology if they feel Plavix is appropriate. His risk factors for stroke include HTN, hyperlipidemia, and diabetes. Will pursue stroke up and risk factor modification. - Obtain MRI head - Obtain echocardiogram - Telemetry - Continue ASA  daily, consider Plavix - Continue Lipitor 10 mg daily. Would not increase statin dose given his age.  - Check HbA1c, lipid panel - PT, OT, SLP evals - Passed RN swallow eval in ED>> Heart healthy diet - Neuro checks  HTN: BP 167/72 on admission, currently in [id number] systolic. He takes Losartan 25 mg daily at home. - Hold home Losartan - Allow permissive HTN for next 24-48 hours   Type 2 DM: No A1c in EPIC. CBG 180 on admission. He is taking Lantus 22 units QHS and Novolog sliding scale at the nursing home. - Check A1c - Continue home Lantus 22 units QHS - Start sensitive ISS - Heart healthy diet - Check CBGs 4 times daily   Hyperlipidemia: Currently on Lipitor 10 mg daily. - Check lipid panel given acute CVA - Continue home Lipitor   Dementia: Currently lives at [facility] facility. He takes Aricept 10 mg QHS.  - Continue home Aricept   Hx Prostate and Bladder Cancer: Wife reports patient follows with Dr.  Borden with Urology here in [place]. Last follow up one month ago and everything normal per wife.   Diet: Heart healthy DVT Ppx: Lovenox SQ Dispo: Admit patient to Observation with expected length of stay less than 2 midnights.  Signed: Juliet Rude, MD  Internal Medicine Resident, PGY-III 04/14/2016, 10:04 AM  Pager: [phone]

## 2016-04-14 NOTE — Consult Note (Signed)
Neurology Consultation Reason for Consult: APhasia Referring Physician: Lyn Hollingshead  CC: Aphasia  History is obtained from:patient   HPI: [name] is a 82 y.o. male he was found with right-sided weakness and aphasia this morning. He was noticed to be waking up around 7:30, and he did say a few words to the nurse. She states that he was groggy at that time, and his not completely certain if he was normal but attributed it to his sleepiness. When she went in at 735, however, he was not speaking at all and had right arm weakness.  En route to the hospital, the right-sided weakness improved. He still did have a significant aphasia on evaluation in the emergency department, but was able to name and was able to answer yes/no questions. He indicated that he did feel abnormal when he woke up.   LKW: 12/7 prior to bed tpa given?: no, outside of window    ROS:  Unable to obtain due to expressive aphasia  Past Medical History:  Diagnosis Date  . Bladder cancer ([place])   . Dementia   . Diabetes mellitus without complication ([place])   . Hyperlipidemia   . Hypertension   . Osteoporosis      No family history on file.   Social History:  reports that he has never smoked. He has never used smokeless tobacco. He reports that he does not drink alcohol or use drugs.   Exam: Current vital signs: Vitals:   04/14/16 1600 04/14/16 1630  BP: 140/66 135/67  Pulse: 73 68  Resp: 21 17  Temp:  98.7 F (37.1 C)    Vital signs in last 24 hours:     Physical Exam  Constitutional: Appears well-developed and well-nourished.  Psych: Affect appropriate to situation Eyes: No scleral injection HENT: No OP obstrucion Head: Normocephalic.  Cardiovascular: Normal rate and regular rhythm.  Respiratory: Effort normal and breath sounds normal to anterior ascultation GI: Soft.  No distension. There is no tenderness.  Skin: WDI  Neuro: Mental Status: Patient Has a significant expressive  aphasia, but is able to name. He is only able to give one word answers. He does appear to understand, however. Cranial Nerves: II: Visual Fields are full. Pupils are equal, round, and reactive to light.   III,IV, VI: EOMI without ptosis or diploplia.  V: Facial sensation is symmetric to temperature VII: Facial movement is symmetric.  VIII: hearing is intact to voice X: Uvula elevates symmetrically XI: Shoulder shrug is symmetric. XII: tongue is midline without atrophy or fasciculations.  Motor: Tone is normal. Bulk is normal. 5/5 strength was present in all four extremities.  Sensory: Sensation is symmetric to light touch and temperature in the arms and legs. Cerebellar: FNF and HKS are intact bilaterally      I have reviewed labs in epic and the results pertinent to this consultation are: Already glucose  I have reviewed the images obtained: CT head-unremarkable, CT angiogram-unremarkable  Impression: 82 year old male with acute onset right-sided weakness and aphasia which is likely improving. Given that his symptoms appeared to have been present when he woke up he is not a candidate for IV TPA and CTA does not reveal an intervention target.  Recommendations: 1. HgbA1c, fasting lipid panel 2. MRI, MRA  of the brain without contrast 3. Frequent neuro checks 4. Echocardiogram 5. Carotid dopplers 6. Prophylactic therapy-Antiplatelet med: Aspirin - dose 325mg  PO or 300mg  PR 7. Risk factor modification 8. Telemetry monitoring 9. PT consult, OT consult, Speech  consult 10. please page stroke NP  Or  PA  Or MD  M-F from 8am -4 pm starting 12/9 as this patient will be followed by the stroke team at this point.   You can look them up on [email or web address]      Roland Rack, MD [facility] [phone]  If 7pm- 7am, please page neurology on call as listed in [place].

## 2016-04-14 NOTE — ED Triage Notes (Signed)
Pt here via [facility] from [facility] for acute mental status changes.  LSN 0730,  ao x 3 to nonverbal, responsive to painful stimuli at 0735.  CBG per staff was 290 and they gave him insulin.

## 2016-04-14 NOTE — ED Provider Notes (Signed)
[facility] DEPT Provider Note   CSN: [id number] Arrival date & time: 04/14/16  [id number]     History   Chief Complaint No chief complaint on file. Chief complaint altered mental status. Level V caveat dementia, altered mental status, acute situation.  HPI [name] is a 82 y.o. male.Level V caveat history is obtained from paramedics. Dr. Leonel Ramsay was present upon patient's arrival and obtained further history from staff at [facility] by telephone. Patient brought from [facility] which is facility where patient resides. Last seen normal at unknown time. He was found to be drowsy this morning patient normally talkative. At 7:30 AM today he was would not talk and not follow commands. He reportedly had right facial droop and right arm weakness EMS obtained a CBG which was 214. No other treatment prior to coming here. Code stroke called in the field  HPI  Past Medical History:  Diagnosis Date  . Bladder cancer ([place])   . Dementia   . Diabetes mellitus without complication ([place])   . Hyperlipidemia   . Hypertension   . Osteoporosis     There are no active problems to display for this patient.   Past Surgical History:  Procedure Laterality Date  . PROSTATE SURGERY    . rotator cuff surgery    . TONSILLECTOMY         Home Medications    Prior to Admission medications   Medication Sig Start Date End Date Taking? Authorizing Provider  alendronate (FOSAMAX) 70 MG tablet Take 70 mg by mouth once a week. Take with a full glass of water on an empty stomach.    Historical Provider, MD  aspirin 81 MG tablet Take 81 mg by mouth daily.    Historical Provider, MD  atorvastatin (LIPITOR) 10 MG tablet Take 10 mg by mouth daily.    Historical Provider, MD  Cholecalciferol (VITAMIN D PO) Take 1 tablet by mouth daily.    Historical Provider, MD  donepezil (ARICEPT) 10 MG tablet Take 10 mg by mouth at bedtime. 12/06/15   Historical Provider, MD  ibuprofen (ADVIL,MOTRIN) 200 MG  tablet Take 200-400 mg by mouth every 6 (six) hours as needed for moderate pain.    Historical Provider, MD  Insulin Glargine (LANTUS SOLOSTAR [place]) Inject 10 Units into the skin every morning.     Historical Provider, MD    Family History No family history on file.  Social History Social History  Substance Use Topics  . Smoking status: Never Smoker  . Smokeless tobacco: Never Used  . Alcohol use No     Allergies   Patient has no known allergies.   Review of Systems Review of Systems  Unable to perform ROS: Mental status change     Physical Exam Updated Vital Signs There were no vitals taken for this visit.  Physical Exam  Constitutional:  Chronically ill-appearing  HENT:  Head: Normocephalic and atraumatic.  Eyes: EOM are normal.  Neck: Neck supple. No tracheal deviation present. No thyromegaly present.  Cardiovascular: Normal rate and regular rhythm.   No murmur heard. Pulmonary/Chest: Effort normal and breath sounds normal.  Abdominal: Soft. Bowel sounds are normal. He exhibits no distension. There is no tenderness.  Musculoskeletal: Normal range of motion. He exhibits no edema or tenderness.  Neurological: He is alert. Coordination normal.  Wiggles his toes upon command. Will not follow other commands.. No spontaneous movement of other extremities.  Skin: Skin is warm and dry. No rash noted.  Psychiatric: He has  a normal mood and affect.  Nursing note and vitals reviewed.    ED Treatments / Results  Labs (all labs ordered are listed, but only abnormal results are displayed) Labs Reviewed  CBG MONITORING, ED - Abnormal; Notable for the following:       Result Value   Glucose-Capillary 180 (*)    All other components within normal limits  PROTIME-INR  APTT  CBC  DIFFERENTIAL  COMPREHENSIVE METABOLIC PANEL  I-STAT TROPOININ, ED  I-STAT CHEM 8, ED    EKG  EKG Interpretation  Date/Time:  Friday April 14 2016 09:07:47 EST Ventricular Rate:  69 PR  Interval:    QRS Duration: 87 QT Interval:  400 QTC Calculation: 429 R Axis:   70 Text Interpretation:  Sinus rhythm No significant change since last tracing Confirmed by Winfred Leeds  MD, Champion Corales [id number]) on 04/14/2016 9:21:44 AM Also confirmed by Winfred Leeds  MD, [place] [phone]), editor [place] CT, Leda Gauze [phone])  on 04/14/2016 9:27:57 AM       Radiology No results found.  Procedures Procedures (including critical care time)  Medications Ordered in ED Medications - No data to display  Results for orders placed or performed during the hospital encounter of 04/14/16  Protime-INR  Result Value Ref Range   Prothrombin Time 13.4 11.4 - 15.2 seconds   INR 1.02   APTT  Result Value Ref Range   aPTT 28 24 - 36 seconds  CBC  Result Value Ref Range   WBC 6.4 4.0 - 10.5 K/uL   RBC 4.12 (L) 4.22 - 5.81 MIL/uL   Hemoglobin 12.8 (L) 13.0 - 17.0 g/dL   HCT 38.3 (L) 39.0 - 52.0 %   MCV 93.0 78.0 - 100.0 fL   MCH 31.1 26.0 - 34.0 pg   MCHC 33.4 30.0 - 36.0 g/dL   RDW 13.2 11.5 - 15.5 %   Platelets 210 150 - 400 K/uL  Differential  Result Value Ref Range   Neutrophils Relative % 57 %   Neutro Abs 3.6 1.7 - 7.7 K/uL   Lymphocytes Relative 30 %   Lymphs Abs 1.9 0.7 - 4.0 K/uL   Monocytes Relative 9 %   Monocytes Absolute 0.6 0.1 - 1.0 K/uL   Eosinophils Relative 4 %   Eosinophils Absolute 0.3 0.0 - 0.7 K/uL   Basophils Relative 0 %   Basophils Absolute 0.0 0.0 - 0.1 K/uL  Comprehensive metabolic panel  Result Value Ref Range   Sodium 139 135 - 145 mmol/L   Potassium 4.0 3.5 - 5.1 mmol/L   Chloride 106 101 - 111 mmol/L   CO2 26 22 - 32 mmol/L   Glucose, Bld 198 (H) 65 - 99 mg/dL   BUN 16 6 - 20 mg/dL   Creatinine, Ser 0.96 0.61 - 1.24 mg/dL   Calcium 9.2 8.9 - 10.3 mg/dL   Total Protein 6.2 (L) 6.5 - 8.1 g/dL   Albumin 3.8 3.5 - 5.0 g/dL   AST 27 15 - 41 U/L   ALT 37 17 - 63 U/L   Alkaline Phosphatase 41 38 - 126 U/L   Total Bilirubin 0.7 0.3 - 1.2 mg/dL   GFR calc non Af Amer >60 >60  mL/min   GFR calc Af Amer >60 >60 mL/min   Anion gap 7 5 - 15  I-stat troponin, ED  Result Value Ref Range   Troponin i, poc 0.00 0.00 - 0.08 ng/mL   Comment 3          CBG monitoring, ED  Result Value Ref Range   Glucose-Capillary 180 (H) 65 - 99 mg/dL   Comment 1 Notify RN    Comment 2 Document in Chart   I-Stat Chem 8, ED  Result Value Ref Range   Sodium 140 135 - 145 mmol/L   Potassium 3.9 3.5 - 5.1 mmol/L   Chloride 103 101 - 111 mmol/L   BUN 18 6 - 20 mg/dL   Creatinine, Ser 1.00 0.61 - 1.24 mg/dL   Glucose, Bld 190 (H) 65 - 99 mg/dL   Calcium, Ion 1.20 1.15 - 1.40 mmol/L   TCO2 25 0 - 100 mmol/L   Hemoglobin 12.9 (L) 13.0 - 17.0 g/dL   HCT 38.0 (L) 39.0 - 52.0 %  CBG monitoring, ED  Result Value Ref Range   Glucose-Capillary 151 (H) 65 - 99 mg/dL   Ct Angio Head W Or Wo Contrast  Result Date: 04/14/2016 CLINICAL DATA:  New onset a aphasia. Patient last seen well 1.5 hours ago. EXAM: CT ANGIOGRAPHY HEAD AND NECK TECHNIQUE: Multidetector CT imaging of the head and neck was performed using the standard protocol during bolus administration of intravenous contrast. Multiplanar CT image reconstructions and MIPs were obtained to evaluate the vascular anatomy. Carotid stenosis measurements (when applicable) are obtained utilizing NASCET criteria, using the distal internal carotid diameter as the denominator. CONTRAST:  50 mL Isovue 370 COMPARISON:  CT head without contrast 04/14/2016. FINDINGS: CTA NECK FINDINGS Aortic arch: A 4 vessel aortic arch is present. The left vertebral artery originates directly from the aortic arch. Atherosclerotic calcifications present in the aorta without aneurysm. There is no significant stenosis at the origins of the great vessels. Right carotid system: The right common carotid artery is within normal limits. The bifurcation is unremarkable. There is moderate tortuosity of the cervical right ICA without significant stenosis. Left carotid system: Focal  calcification is present anteriorly in the distal left common carotid artery without significant stenosis. There is minimal calcification at the carotid bifurcation without stenosis. Moderate distal cervical left ICA tortuosity is present without significant stenosis. Vertebral arteries: The right vertebral artery originates from the subclavian artery in is the dominant vessel. There is marked tortuosity of the right vertebral artery just after it enters the spinal canal at C5-6. There is no significant focal stenosis or vascular injury to either vertebral artery in the neck. Skeleton: Multilevel endplate degenerative changes are present from C3-4 through C6-7. There is congenital fusion at C2-3. Osseous foraminal narrowing is most pronounced at C3-4 and C4-5, right greater than left. No focal lytic or blastic lesions are present. Other neck: No focal mucosal or submucosal lesions are present. The thyroid is within normal limits. There is no significant adenopathy. Salivary glands are within normal limits. Upper chest: The lung apices are clear. The superior mediastinum is unremarkable. Review of the MIP images confirms the above findings CTA HEAD FINDINGS Anterior circulation: Atherosclerotic calcifications are present within the cavernous internal carotid arteries bilaterally without significant luminal stenosis. Posterior communicating arteries are present bilaterally. The ICA termini are within normal limits. The A1 and M1 segments are normal. The MCA bifurcations are intact. The ACA and MCA branch vessels are within normal limits. Posterior circulation: The right vertebral artery is the dominant vessel. Atherosclerotic calcifications are present at the dural margin of both vertebral arteries. The PICA origins are below the dural margin. There is moderate stenosis of the left vertebral artery at calcification. The vertebrobasilar junction is normal. Go the posterior cerebral arteries are fed by P1 segments  and  posterior communicating arteries. PCA branch vessels are within normal limits. Venous sinuses: The dural sinuses are patent bilaterally. Straight sinus and deep cerebral veins are intact. Cortical veins are unremarkable. Anatomic variants: Prominent posterior communicating arteries bilaterally. Delayed phase: A 2.1 x 1.2 x 1.6 cm meningioma adjacent to the right temporal tip demonstrates enhancement on the postcontrast images. No true delayed images were performed. No other enhancing lesions are evident. Review of the MIP images confirms the above findings IMPRESSION: 1. Moderate tortuosity of the distal cervical internal carotid arteries without significant stenosis. This is most often related to chronic hypertension. 2. Minimal atherosclerotic changes at the left carotid bifurcation without significant stenosis. 3. Atherosclerotic calcifications involving the cavernous internal carotid arteries and vertebral arteries at the dural margins bilaterally. Moderate left vertebral artery narrowing is present. There are no other significant related stenoses at these levels. 4. Normal variant circle of Willis without significant proximal stenosis, aneurysm, or branch vessel occlusion. 5. Enhancing 2.1 cm meningioma adjacent to the right temporal tip. These results were called by telephone at the time of interpretation on 04/14/2016 at 9:04 am to Dr. Roland Rack , who verbally acknowledged these results. Electronically Signed   By: San Morelle M.D.   On: 04/14/2016 09:30   Ct Angio Neck W Or Wo Contrast  Result Date: 04/14/2016 CLINICAL DATA:  New onset a aphasia. Patient last seen well 1.5 hours ago. EXAM: CT ANGIOGRAPHY HEAD AND NECK TECHNIQUE: Multidetector CT imaging of the head and neck was performed using the standard protocol during bolus administration of intravenous contrast. Multiplanar CT image reconstructions and MIPs were obtained to evaluate the vascular anatomy. Carotid stenosis  measurements (when applicable) are obtained utilizing NASCET criteria, using the distal internal carotid diameter as the denominator. CONTRAST:  50 mL Isovue 370 COMPARISON:  CT head without contrast 04/14/2016. FINDINGS: CTA NECK FINDINGS Aortic arch: A 4 vessel aortic arch is present. The left vertebral artery originates directly from the aortic arch. Atherosclerotic calcifications present in the aorta without aneurysm. There is no significant stenosis at the origins of the great vessels. Right carotid system: The right common carotid artery is within normal limits. The bifurcation is unremarkable. There is moderate tortuosity of the cervical right ICA without significant stenosis. Left carotid system: Focal calcification is present anteriorly in the distal left common carotid artery without significant stenosis. There is minimal calcification at the carotid bifurcation without stenosis. Moderate distal cervical left ICA tortuosity is present without significant stenosis. Vertebral arteries: The right vertebral artery originates from the subclavian artery in is the dominant vessel. There is marked tortuosity of the right vertebral artery just after it enters the spinal canal at C5-6. There is no significant focal stenosis or vascular injury to either vertebral artery in the neck. Skeleton: Multilevel endplate degenerative changes are present from C3-4 through C6-7. There is congenital fusion at C2-3. Osseous foraminal narrowing is most pronounced at C3-4 and C4-5, right greater than left. No focal lytic or blastic lesions are present. Other neck: No focal mucosal or submucosal lesions are present. The thyroid is within normal limits. There is no significant adenopathy. Salivary glands are within normal limits. Upper chest: The lung apices are clear. The superior mediastinum is unremarkable. Review of the MIP images confirms the above findings CTA HEAD FINDINGS Anterior circulation: Atherosclerotic calcifications  are present within the cavernous internal carotid arteries bilaterally without significant luminal stenosis. Posterior communicating arteries are present bilaterally. The ICA termini are within normal limits. The A1 and  M1 segments are normal. The MCA bifurcations are intact. The ACA and MCA branch vessels are within normal limits. Posterior circulation: The right vertebral artery is the dominant vessel. Atherosclerotic calcifications are present at the dural margin of both vertebral arteries. The PICA origins are below the dural margin. There is moderate stenosis of the left vertebral artery at calcification. The vertebrobasilar junction is normal. Go the posterior cerebral arteries are fed by P1 segments and posterior communicating arteries. PCA branch vessels are within normal limits. Venous sinuses: The dural sinuses are patent bilaterally. Straight sinus and deep cerebral veins are intact. Cortical veins are unremarkable. Anatomic variants: Prominent posterior communicating arteries bilaterally. Delayed phase: A 2.1 x 1.2 x 1.6 cm meningioma adjacent to the right temporal tip demonstrates enhancement on the postcontrast images. No true delayed images were performed. No other enhancing lesions are evident. Review of the MIP images confirms the above findings IMPRESSION: 1. Moderate tortuosity of the distal cervical internal carotid arteries without significant stenosis. This is most often related to chronic hypertension. 2. Minimal atherosclerotic changes at the left carotid bifurcation without significant stenosis. 3. Atherosclerotic calcifications involving the cavernous internal carotid arteries and vertebral arteries at the dural margins bilaterally. Moderate left vertebral artery narrowing is present. There are no other significant related stenoses at these levels. 4. Normal variant circle of Willis without significant proximal stenosis, aneurysm, or branch vessel occlusion. 5. Enhancing 2.1 cm meningioma  adjacent to the right temporal tip. These results were called by telephone at the time of interpretation on 04/14/2016 at 9:04 am to Dr. Roland Rack , who verbally acknowledged these results. Electronically Signed   By: San Morelle M.D.   On: 04/14/2016 09:30   Ct Head Code Stroke W/o Cm  Result Date: 04/14/2016 CLINICAL DATA:  Code stroke. Acute onset aphasia and unresponsive. The patient was last seen normal 1.5 hours ago. EXAM: CT HEAD WITHOUT CONTRAST TECHNIQUE: Contiguous axial images were obtained from the base of the skull through the vertex without intravenous contrast. COMPARISON:  CT head without contrast 12/17/2015 FINDINGS: Brain: Moderate atrophy and diffuse white matter disease is similar to the prior exam. There multiple remote lacunar infarcts within the basal ganglia bilaterally. No new infarcts are present. The insular ribbon is intact bilaterally. No focal cortical lesions are present otherwise. A calcified meningioma is present adjacent to the right temporal tip. The ventricles are proportionate to the degree of atrophy. No significant extra-axial fluid collection is present. Vascular: Atherosclerotic calcifications are present in the cavernous internal carotid arteries bilaterally and at the dural margin of both vertebral arteries. There is no significant asymmetric density of the intracranial arteries. Skull: Calvarium is unremarkable. No focal lytic or blastic lesions are present. Sinuses/Orbits: The paranasal sinuses and mastoid air cells are clear. The globes and orbits are intact. Bilateral lens extractions are noted. ASPECTS ([place] Stroke Program Early CT Score) - Ganglionic level infarction (caudate, lentiform nuclei, internal capsule, insula, M1-M3 cortex): 7/7 - Supraganglionic infarction (M4-M6 cortex): 3/3 Total score (0-10 with 10 being normal): 10/10 IMPRESSION: 1. Stable advanced atrophy and white matter disease without acute intracranial abnormality or  significant interval change. 2. ASPECTS is 10/10 These results were called by telephone at the time of interpretation on 04/14/2016 at 9:03 am to Dr. Leonel Ramsay, who verbally acknowledged these results. Electronically Signed   By: San Morelle M.D.   On: 04/14/2016 09:11   Initial Impression / Assessment and Plan / ED Course  I have reviewed the triage vital signs and  the nursing notes.  Pertinent labs & imaging results that were available during my care of the patient were reviewed by me and considered in my medical decision making (see chart for details). 9:10 PM patient all his extremities. Answers questions however not always appropriate. Follow simple commands. NIH stroke scale calculated at 2 Clinical Course   Initial NIH stroke scale upon presentation calculated at 8. Patient improving with time.  Patient felt not to be candidate for thrombolytic. Dr. Leonel Ramsay recommends admission to medical service Patient passed swallowing screen. Aspirin ordered At 9:40 AM patient's wife was present in room and she states that he appears as his normal self.Internal medicine teaching service consulted for overnight stay, who will see patient in ED Final Clinical Impressions(s) / ED Diagnoses   Dx TIA Final diagnoses:  None    New Prescriptions New Prescriptions   No medications on file     Orlie Dakin, MD 04/14/16 [phone]

## 2016-04-14 NOTE — H&P (Signed)
Date: 04/14/2016               Patient Name:  [name] MRN: [id number]  DOB: 1929/07/19 Age / Sex: 82 y.o., male   PCP: Hulan Fess, MD              Medical Service: Internal Medicine Teaching Service              Attending Physician: Dr. Aldine Contes, MD    First Contact: Sydnee Levans, MS 3 Pager: [phone]  Second Contact: Dr. Philipp Ovens Pager: [phone]  Third Contact Dr. Charlynn Grimes Pager: [phone]       After Hours (After 5p/  First Contact Pager: [phone]  weekends / holidays): Second Contact Pager: [phone]   Chief Complaint: Dysarthria and weakness  History of Present Illness: [name] is a 82 yo M with a PMH of dementia, HTN, T2DM, HLD, Bladder and prostate cancer who presented to the ED with R sided facial weakness and dysarthria. The patient was found to be disoriented by staff at his residency facility at 730AM. The patient then began to develop dysarthria but was comprehending commands at 745. He was brought to the hospital for stroke evaluation. Per EMS the patient exhibited right facial droop and right arm weakness.  In the Ed, the patient was afebrile at 97.6, HR of 68, RR of 14, BP of 167/72, O2 of 98 on Ra. Exam was notable for no spontaneous movement of extremities other then toes. He was alert and coordinated. NIH stroke scale initially of 8 but repeat was 3. CT of head showed no intracranial bleed. ECG was NSR. Labs were drawn: CBC diff was wnl. CMP was wnl. Trop was negative. ECG was NSR. MRI showed no acute infarction but a benign 2.1 cm meningioma. Patient passed swallowing screen. ASA was administered and patient was transferred to inpatient teaching.   The wife states she met [name] in the hospital during his stroke workup. She initially noted him to be speaking abnormally with a slight facial droop. She states that he was initially off his baseline when asking him questions, although some may have been to his sense of humor earlier. AS of that afternoon he has  returned to his baseline.   Wife states the patient had recently been having mechanical falls and having difficulty with his glucose. Although he is stable walking with his walker,  the patient becomes stubborn when having to go to the bathroom from his bed and uses furniture for help. The patient had fallen a number of times in August. His wife states that the patient poor control of his glucose had been due to recent medication change for his diabetes.   Meds: Current Facility-Administered Medications  Medication Dose Route Frequency Provider Last Rate Last Dose  .  stroke: mapping our early stages of recovery book   Does not apply Once Juliet Rude, MD      . Derrill Memo ON 04/15/2016] aspirin tablet 81 mg  81 mg Oral Daily Carly J Rivet, MD      . atorvastatin (LIPITOR) tablet 10 mg  10 mg Oral Daily Carly J Rivet, MD      . cholecalciferol (VITAMIN D) tablet 1,000 Units  1,000 Units Oral Daily Carly J Rivet, MD      . donepezil (ARICEPT) tablet 10 mg  10 mg Oral QHS Carly J Rivet, MD      . enoxaparin (LOVENOX) injection 40 mg  40 mg Subcutaneous Q24H Juliet Rude, MD      .  insulin aspart (novoLOG) injection 0-5 Units  0-5 Units Subcutaneous QHS Carly J Rivet, MD      . insulin aspart (novoLOG) injection 0-9 Units  0-9 Units Subcutaneous TID WC Carly J Rivet, MD      . insulin glargine (LANTUS) injection 22 Units  22 Units Subcutaneous Daily Carly J Rivet, MD      . nystatin cream (MYCOSTATIN) 1 application  1 application Topical BID PRN Carly J Rivet, MD      . sodium chloride flush (NS) 0.9 % injection 3 mL  3 mL Intravenous Q12H Juliet Rude, MD       Current Outpatient Prescriptions  Medication Sig Dispense Refill  . acetaminophen (TYLENOL) 325 MG tablet Take 650 mg by mouth every 6 (six) hours as needed for mild pain or fever.    Marland Kitchen alendronate (FOSAMAX) 70 MG tablet Take 70 mg by mouth once a week. Take with a full glass of water on an empty stomach.    Marland Kitchen aspirin 81 MG tablet Take 81  mg by mouth daily.    Marland Kitchen atorvastatin (LIPITOR) 10 MG tablet Take 10 mg by mouth daily.    . cholecalciferol (VITAMIN D) 1000 units tablet Take 1,000 Units by mouth daily.    Marland Kitchen donepezil (ARICEPT) 10 MG tablet Take 10 mg by mouth at bedtime.    . insulin aspart (NOVOLOG) 100 UNIT/ML injection Inject 0-11 Units into the skin 3 (three) times daily before meals. Sliding scale    . insulin glargine (LANTUS) 100 UNIT/ML injection Inject 22 Units into the skin daily.    Marland Kitchen losartan (COZAAR) 25 MG tablet Take 25 mg by mouth daily.    Marland Kitchen nystatin cream (MYCOSTATIN) Apply 1 application topically 2 (two) times daily as needed for dry skin.    . Insulin Glargine (LANTUS SOLOSTAR ) Inject 10 Units into the skin every morning.       Allergies: Allergies as of 04/14/2016 - Review Complete 04/14/2016  Allergen Reaction Noted  . Actos [pioglitazone]  04/14/2016   Past Medical History:  Diagnosis Date  . Bladder cancer ([place])   . Dementia   . Diabetes mellitus without complication ([place])   . Hyperlipidemia   . Hypertension   . Osteoporosis    Past Surgical History:  Procedure Laterality Date  . PROSTATE SURGERY    . rotator cuff surgery    . TONSILLECTOMY     No family history on file. Social History   Social History  . Marital status: Married    Spouse name: N/A  . Number of children: N/A  . Years of education: N/A   Occupational History  . Not on file.   Social History Main Topics  . Smoking status: Never Smoker  . Smokeless tobacco: Never Used  . Alcohol use No  . Drug use: No  . Sexual activity: Not on file   Other Topics Concern  . Not on file   Social History Narrative  . No narrative on file    Review of Systems: Constitutional: negative for chills, fatigue, fevers and sweats Ears, nose, mouth, throat, and face: negative for nasal congestion and sore throat migraines Respiratory: negative for pleurisy/chest pain, SOB Cardiovascular: negative for chest pain, fatigue and  palpitations Gastrointestinal: negative for constipation, diarrhea, nausea and vomiting Genitourinary: negative for dysuria Hematologic/lymphatic: negative for bleeding Musculoskeletal:negative for arthralgias Neurological: negative for headaches  Physical Exam: Blood pressure 151/72, pulse 66, temperature 97.6 F (36.4 C), resp. rate 15, weight 68.6 kg (151  lb 3.8 oz), SpO2 99 %. Gen: old appearing adult conversant with team, Oriented x 4.  HEENT: atraumatic. Normocephalic. No nasal discharge. Oropharynx moist without lesion. Tongue is unremarkable. PERRL. Anicteric. Throat is unremarkable. No thyroid masses palpated.  Cv: . RRR. Nl S1 S2. III/VI Systolic murmur.  rubs or heaves.  Vascular: Radial, dorsalis pedis and posterior tibial pulses 2+.  Resp:  NWB. CTAB. Chest expansion symmetric.  Ab:  Non distended. Normal bowel sounds.  Neuro: Oriented x 3. No neck stiffness. EOM intact. Facial sensation is symmetric and intact. Facial movement is intact but asymmetric. Hearing is symmetric. No dysarthria. Uvula and tongue midline. Shoulder and SCM strength 2+. Strength and sensation throughout in tact and 2+. Normal finger to nose. Patella reflexes 2+.  Ext: No obvious rashes. Warm. Cap refill <2 secs. Skin:  No obvious rashes.  MSK: No obviously deformed joints Psych: normal mood and affect  Lab results: Glucose was 180. PT was 13.4. PTT was 28. CBC was notable for hgb of 12.8. CMP was wnl. A1c pending. lipids wnl. Trops were negative.   Imaging results:  CT head - 04/14/16 850AMStable advanced atrophy and white matter disease without acute intracranial abnormality or significant interval change.  ASPECTS is 10/10  CT Angio head and neck 0902 04/14/16 - Moderate tortuosity of the distal cervical internal carotid arteries without significant stenosis. This is most often related to chronic hypertension. Minimal atherosclerotic changes at the left carotid bifurcation without significant  stenosis. Atherosclerotic calcifications involving the cavernous internal carotid arteries and vertebral arteries at the dural margins bilaterally. Moderate left vertebral artery narrowing is present. There are no other significant related stenoses at these levels. Normal variant circle of Willis without significant proximal stenosis, aneurysm, or branch vessel occlusion. Enhancing 2.1 cm meningioma adjacent to the right temporal tip. MR brain - 1439 04/14/16 - Negative for acute infarct. Moderate to advanced atrophy. Chronic microvascular ischemic change 12 x 19 mm meningioma lateral to the right temporal lobe.  Other results: PJA:[id number] review. NSR.   Assessment & Plan by Problem: Active Problems:   TIA (transient ischemic attack)  Mr. Rhinesmith is a 82 yo M with a PMH of dementia, HTN, T2DM, HLD, Bladder cancer who presented to the ED with TIA.   TIA - R sided facial droop and R arm weakness is consistent with an ischemia of the R ACA. CT of head showed no acute intracranial process. CTA of head and neck showed some hypertensive induced pathology with some arthrosclerotic changes but no signficant changes. There was a 2.1 cm meningioma that is not consistent with his presentation. MRI of head was negative for an acute infact or intracranial process, aside from the meningioma. This patient's presentation is most likely due to a TIA. Others to include are migraines, seizures or drugs. Given TIA is most likely and a independent risk factor for stoke. Will admit and observe overnight and start stroke prevent.    - Risk for stroke ABCD: 2-Day Stroke Risk: 4.1% 7-Day Stroke Risk: 5.9% 90-Day Stroke Risk: 9.8%  - swallow eval passed  - Neuro consult  - echo  - tele  - ASA   - Consider plavix with h/o falls and difficult to control glucose  - Lipitor 10 mg  T2DM  - Check A1c  - Home lantus 22units qhs  - SSI/CBGs q4  HTN - 160s-170s since admission.   - will hold BP meds until discharge  tomorrow  HLD  - lipids within normal   -  Lipitor 10 mg  Dementia  -  aricept  FENGI  - HH  - DVT pro: Lovenox 40 mg  This is a Careers information officer Note.  The care of the patient was discussed with Dr. Philipp Ovens and the assessment and plan was formulated with their assistance.  Please see their note for official documentation of the patient encounter.   Signed: Benn Moulder, Medical Student 04/14/2016, 3:13 PM

## 2016-04-14 NOTE — ED Notes (Addendum)
Pt arrived with stroke-like symptoms, rt sided weakness and altered LOC; pt now responsive, A&Ox 3, disoriented to situation; last neuro at 1500, pt scored 1 when asked age. Next neuro due at 1700. Pt passed stroke swallow screen; initial NIH 8, repeat NIH 0. Family at beside.

## 2016-04-15 ENCOUNTER — Observation Stay (HOSPITAL_COMMUNITY): Payer: Medicare Other

## 2016-04-15 ENCOUNTER — Observation Stay (HOSPITAL_BASED_OUTPATIENT_CLINIC_OR_DEPARTMENT_OTHER): Payer: Medicare Other

## 2016-04-15 DIAGNOSIS — Z794 Long term (current) use of insulin: Secondary | ICD-10-CM

## 2016-04-15 DIAGNOSIS — E1165 Type 2 diabetes mellitus with hyperglycemia: Secondary | ICD-10-CM | POA: Diagnosis not present

## 2016-04-15 DIAGNOSIS — G451 Carotid artery syndrome (hemispheric): Secondary | ICD-10-CM

## 2016-04-15 DIAGNOSIS — I10 Essential (primary) hypertension: Secondary | ICD-10-CM | POA: Diagnosis not present

## 2016-04-15 DIAGNOSIS — G3183 Dementia with Lewy bodies: Secondary | ICD-10-CM | POA: Diagnosis not present

## 2016-04-15 DIAGNOSIS — Z9119 Patient's noncompliance with other medical treatment and regimen: Secondary | ICD-10-CM

## 2016-04-15 DIAGNOSIS — G459 Transient cerebral ischemic attack, unspecified: Secondary | ICD-10-CM | POA: Diagnosis not present

## 2016-04-15 DIAGNOSIS — E785 Hyperlipidemia, unspecified: Secondary | ICD-10-CM | POA: Diagnosis not present

## 2016-04-15 DIAGNOSIS — E1159 Type 2 diabetes mellitus with other circulatory complications: Secondary | ICD-10-CM

## 2016-04-15 DIAGNOSIS — F0280 Dementia in other diseases classified elsewhere without behavioral disturbance: Secondary | ICD-10-CM

## 2016-04-15 DIAGNOSIS — R4701 Aphasia: Secondary | ICD-10-CM | POA: Diagnosis not present

## 2016-04-15 LAB — ECHOCARDIOGRAM COMPLETE
CHL CUP RV SYS PRESS: 19 mmHg
CHL CUP TV REG PEAK VELOCITY: 201 cm/s
E decel time: 345 msec
EERAT: 8.08
FS: 41 % (ref 28–44)
IV/PV OW: 0.88
LA ID, A-P, ES: 31 mm
LA diam index: 1.69 cm/m2
LA vol A4C: 42.6 ml
LA vol index: 27 mL/m2
LA vol: 49.7 mL
LDCA: 3.14 cm2
LEFT ATRIUM END SYS DIAM: 31 mm
LV E/e'average: 8.08
LV TDI E'LATERAL: 9.25
LV TDI E'MEDIAL: 7.51
LVEEMED: 8.08
LVELAT: 9.25 cm/s
LVOT VTI: 21.3 cm
LVOT diameter: 20 mm
LVOTPV: 92.3 cm/s
LVOTSV: 67 mL
Lateral S' vel: 15.7 cm/s
MV Dec: 345
MV Peak grad: 2 mmHg
MV pk A vel: 104 m/s
MV pk E vel: 74.7 m/s
PW: 8.33 mm — AB (ref 0.6–1.1)
RV TAPSE: 27.7 mm
TR max vel: 201 cm/s
Weight: 2419.77 oz

## 2016-04-15 LAB — HEMOGLOBIN A1C
HEMOGLOBIN A1C: 8.1 % — AB (ref 4.8–5.6)
Mean Plasma Glucose: 186 mg/dL

## 2016-04-15 LAB — GLUCOSE, CAPILLARY
GLUCOSE-CAPILLARY: 173 mg/dL — AB (ref 65–99)
GLUCOSE-CAPILLARY: 171 mg/dL — AB (ref 65–99)
GLUCOSE-CAPILLARY: 277 mg/dL — AB (ref 65–99)

## 2016-04-15 MED ORDER — CLOPIDOGREL BISULFATE 75 MG PO TABS: 75.0000 mg | ORAL_TABLET | Freq: Every day | ORAL | 2 refills | Status: AC

## 2016-04-15 NOTE — Clinical Social Work Note (Signed)
Clinical Social Work Assessment  Patient Details  Name: [name] MRN: [id number] Date of Birth: 17-Apr-1930  Date of referral:  04/15/16               Reason for consult:  Facility Placement                Permission sought to share information with:  Facility Sport and exercise psychologist, Family Supports Permission granted to share information::  No (Disoriented)  Name::     [name]  Agency::  [facility]  Relationship::  Daughter  Contact Information:  [phone]  Housing/Transportation Living arrangements for the past 2 months:  [place], [place] of Information:  Adult Children Patient Interpreter Needed:  None Criminal Activity/Legal Involvement Pertinent to Current Situation/Hospitalization:  No - Comment as needed Significant Relationships:  Adult Children Lives with:  Facility Resident Do you feel safe going back to the place where you live?  Yes Need for family participation in patient care:  Yes (Comment)  Care giving concerns:  CSW received consult regarding discharge planning. Patient is disoriented. CSW spoke with patient's daughter. Patient is from [facility] and will return at discharge. CSW to continue to follow for discharge needs.   Social Worker assessment / plan:  Patient to discharge back to snf.  Employment status:  Retired Forensic scientist:  Medicare PT Recommendations:  Not assessed at this time [place] / Referral to community resources:  [place]  Patient/Family's Response to care:  Patient's family reports wanting to get him back to snf. They will take him by car.   Patient/Family's Understanding of and Emotional Response to Diagnosis, Current Treatment, and Prognosis:  Patient's daughter expressed understanding of CSW role and discharge process. No questions/concerns about plan or treatment.    Emotional Assessment Appearance:  Appears stated age Attitude/Demeanor/Rapport:  Unable  to Assess Affect (typically observed):  Unable to Assess Orientation:  Oriented to Self Alcohol / Substance use:  Not Applicable Psych involvement (Current and /or in the community):  No (Comment)  Discharge Needs  Concerns to be addressed:  Care Coordination Readmission within the last 30 days:  No Current discharge risk:  None Barriers to Discharge:  No Barriers Identified   Benard Halsted, [place] 04/15/2016, 11:52 AM

## 2016-04-15 NOTE — Progress Notes (Signed)
PT Cancellation Note  Patient Details Name: [name] MRN: [id number] DOB: April 30, 1930   Cancelled Treatment:    Reason Eval/Treat Not Completed: Patient at procedure or test/unavailable. Currently undergoing EEG. Have spoken with physician re: hopeful discharge today. Will return ASAP   [name] 04/15/2016, 12:24 PM Pager [phone]

## 2016-04-15 NOTE — Procedures (Signed)
History: 82 year old male with a history of transient aphasia and right arm weakness  Sedation: None  Technique: This is a 21 channel routine scalp EEG performed at the bedside with bipolar and monopolar montages arranged in accordance to the international 10/20 system of electrode placement. One channel was dedicated to EKG recording.    Background: The background consists of intermixed alpha and beta activities. There is a well defined posterior dominant rhythm of 10 Hz that attenuates with eye opening. He has an increase in generalized delta status he with drowsiness, but  sleep is not recorded.   Photic stimulation: Physiologic driving is not performed  EEG Abnormalities: None  Clinical Interpretation: This normal EEG is recorded in the waking and drowsy state. There was no seizure or seizure predisposition recorded on this study. Please note that a normal EEG does not preclude the possibility of epilepsy.   Roland Rack, MD [facility] [phone]  If 7pm- 7am, please page neurology on call as listed in [place].

## 2016-04-15 NOTE — Progress Notes (Signed)
STROKE TEAM PROGRESS NOTE   HISTORY OF PRESENT ILLNESS (per record) [name] is a 82 y.o. male he was found with right-sided weakness and aphasia this morning. He was noticed to be waking up around 7:30, and he did say a few words to the nurse. She states that he was groggy at that time, and his not completely certain if he was normal but attributed it to his sleepiness. When she went in at 735, however, he was not speaking at all and had right arm weakness.  En route to the hospital, the right-sided weakness improved. He still did have a significant aphasia on evaluation in the emergency department, but was able to name and was able to answer yes/no questions. He indicated that he did feel abnormal when he woke up.   LKW: 12/7 prior to bed tpa given?: no, outside of window    SUBJECTIVE (INTERVAL HISTORY) His wife and daughter are at the bedside.  Overall he feels his condition is completely resolved. He stated that he cannot remember what happened yesterday, however, according to family, he had aphasia and right-sided weakness which resolved within 1 hour. MRI negative for acute stroke. EEG done to rule out seizure, report pending.   OBJECTIVE Temp:  [97.6 F (36.4 C)-98.7 F (37.1 C)] 98.5 F (36.9 C) (12/08 2133) Pulse Rate:  [58-86] 70 (12/09 0000) Cardiac Rhythm: Normal sinus rhythm (12/08 1900) Resp:  [13-21] 16 (12/08 2133) BP: (120-181)/(63-93) 170/63 (12/09 0000) SpO2:  [97 %-100 %] 98 % (12/09 0000) Weight:  [68.6 kg (151 lb 3.8 oz)] 68.6 kg (151 lb 3.8 oz) (12/08 0908)  CBC:  Recent Labs Lab 04/14/16 0846 04/14/16 0852  WBC 6.4  --   NEUTROABS 3.6  --   HGB 12.8* 12.9*  HCT 38.3* 38.0*  MCV 93.0  --   PLT 210  --     Basic Metabolic Panel:  Recent Labs Lab 04/14/16 0846 04/14/16 0852  NA 139 140  K 4.0 3.9  CL 106 103  CO2 26  --   GLUCOSE 198* 190*  BUN 16 18  CREATININE 0.96 1.00  CALCIUM 9.2  --     Lipid Panel:    Component Value  Date/Time   CHOL 128 04/14/2016 0846   TRIG 60 04/14/2016 0846   HDL 56 04/14/2016 0846   CHOLHDL 2.3 04/14/2016 0846   VLDL 12 04/14/2016 0846   LDLCALC 60 04/14/2016 0846   HgbA1c:  Lab Results  Component Value Date   HGBA1C 8.1 (H) 04/14/2016   Urine Drug Screen: No results found for: LABOPIA, COCAINSCRNUR, LABBENZ, AMPHETMU, THCU, LABBARB    IMAGING I have personally reviewed the radiological images below and agree with the radiology interpretations.  Ct Angio Head and Neck W Or Wo Contrast 04/14/2016 1. Moderate tortuosity of the distal cervical internal carotid arteries without significant stenosis. This is most often related to chronic hypertension.  2. Minimal atherosclerotic changes at the left carotid bifurcation without significant stenosis.  3. Atherosclerotic calcifications involving the cavernous internal carotid arteries and vertebral arteries at the dural margins bilaterally. Moderate left vertebral artery narrowing is present. There are no other significant related stenoses at these levels.  4. Normal variant circle of Willis without significant proximal stenosis, aneurysm, or branch vessel occlusion.  5. Enhancing 2.1 cm meningioma adjacent to the right temporal tip.   Mr Brain Wo Contrast 04/14/2016 Negative for acute infarct. Moderate to advanced atrophy.  Chronic microvascular ischemic change 12 x 19 mm meningioma  lateral to the right temporal lobe.   Ct Head Code Stroke W/o Cm 04/14/2016 1. Stable advanced atrophy and white matter disease without acute intracranial abnormality or significant interval change.  2. ASPECTS is 10/10   Transthoracic echocardiogram 04/15/2016 Study Conclusions - Left ventricle: The cavity size was normal. There was focal basal   hypertrophy. Systolic function was normal. The estimated ejection   fraction was in the range of 60% to 65%. Wall motion was normal;   there were no regional wall motion abnormalities. Doppler    parameters are consistent with abnormal left ventricular   relaxation (grade 1 diastolic dysfunction). - Aortic valve: Mildly calcified annulus. Trileaflet; mildly   thickened leaflets. Valve area (VTI): 3.27 cm^2. Valve area   (Vmax): 3.14 cm^2. Valve area (Vmean): 2.76 cm^2. - Technically adequate study.    PHYSICAL EXAM Constitutional: Appears well-developed and well-nourished.  Psych: Affect appropriate to situation Eyes: No scleral injection HENT: No OP obstrucion Head: Normocephalic.  Cardiovascular: Normal rate and regular rhythm.  Respiratory: Effort normal and breath sounds normal to anterior ascultation GI: Soft.  No distension. There is no tenderness.  Skin: WDI  Neuro: Mental Status: Patient is orientated to place, month and people, not orientated to year. Language examination normal, no aphasia, naming and repeating intact Cranial Nerves: II: Visual Fields are full. Pupils are equal, round, and reactive to light.   III,IV, VI: EOMI without ptosis or diploplia.  V: Facial sensation is symmetric to temperature VII: Facial movement is symmetric.  VIII: hearing is intact to voice X: Uvula elevates symmetrically XI: Shoulder shrug is symmetric. XII: tongue is midline without atrophy or fasciculations.  Motor: Tone is normal. Bulk is normal. 5/5 strength was present in all four extremities.  Sensory: Sensation is symmetric to light touch and temperature in the arms and legs. Cerebellar: FNF and HKS are intact bilaterally   ASSESSMENT/PLAN Mr. [name] is a 82 y.o. male with history of prostate cancer, hypertension, hyperlipidemia, dementia, and diabetes mellitus, and bladder cancer presenting with right-sided weakness and aphasia. He did not receive IV t-PA due to late presentation.  Possible left brain cortical TIA: source unknown, suspect cardioembolic phenomena.  Resultant  neuro deficits resolved  MRI - no acute infarct  CTA H&N - no significant  stenosis, mild bilateral carotid siphon atherosclerosis  2D Echo - EF 60-65%. No cardiac source of emboli identified.  Recommend 30 day cardiac event monitoring as outpatient to rule out A. fib  LDL - 60  HgbA1c - 8.1  VTE prophylaxis - Lovenox  Diet Heart Room service appropriate? Yes; Fluid consistency: Thin  aspirin 81 mg daily prior to admission, now on aspirin 81 mg daily. Recommend switch to Plavix for further stroke prevention  Patient counseled to be compliant with his antithrombotic medications  Ongoing aggressive stroke risk factor management  Therapy recommendations: pending  Disposition: Pending  Hypertension  Stable  Permissive hypertension (OK if < 220/120) but gradually normalize in 5-7 days  Long-term BP goal normotensive  Hyperlipidemia  Home meds:  Lipitor 10 mg daily resumed in hospital  LDL 60, goal < 70  Continue statin at discharge  Diabetes  HgbA1c 8.1, goal < 7.0  Uncontrolled  SSI  CBG monitoring  Close follow-up with PCP for DM control  Other Stroke Risk Factors  Advanced age  Other Active Problems  Dementia - on Aricept  Mild anemia  Hospital day # 0  Neurology will sign off. Please call with questions. Pt will follow up  with carolyn Hassell Done NP at [facility] in about 6 weeks. Thanks for the consult.  Rosalin Hawking, MD PhD Stroke Neurology 04/15/2016 2:54 PM   To contact Stroke Continuity provider, please refer to [email or web address] After hours, contact General Neurology

## 2016-04-15 NOTE — Progress Notes (Signed)
Echocardiogram 2D Echocardiogram has been performed.  [name] 04/15/2016, 11:46 AM

## 2016-04-15 NOTE — Evaluation (Signed)
Physical Therapy Evaluation Patient Details Name: [name] MRN: [id number] DOB: [birth year] Today's Date: 04/15/2016   History of Present Illness  82 y.o.malehe was found with right-sided weakness and aphasia. Symptoms resolved en route to ED. MRI--Negative for acute infarct. 12 x 19 mm meningioma lateral to the right temporal lobe.      Clinical Impression  Patient evaluated by Physical Therapy with no further acute PT needs identified. Family present during evaluation and agree patient is at baseline. All education has been completed and the patient/family has no further questions. Patient is safe to be transported back to SNF in family vehicle. PT is signing off. Thank you for this referral.     Follow Up Recommendations SNF    Equipment Recommendations  None recommended by PT    Recommendations for Other Services       Precautions / Restrictions Precautions Precautions: Fall Precaution Comments: h/o dementia and reports prior falls      Mobility  Bed Mobility Overal bed mobility: Needs Assistance Bed Mobility: Supine to Sit;Sit to Supine     Supine to sit: Min assist;HOB elevated Sit to supine: Supervision   General bed mobility comments: assist to scoot out to EOB   Transfers Overall transfer level: Needs assistance Equipment used: Rolling walker (2 wheeled) Transfers: Sit to/from Stand Sit to Stand: Min guard         General transfer comment: x 2 from EOB, no rail  Ambulation/Gait Ambulation/Gait assistance: Min guard Ambulation Distance (Feet): 120 Feet Assistive device: Rolling walker (2 wheeled) Gait Pattern/deviations: Step-through pattern;Decreased stride length;Trunk flexed     General Gait Details: consistent vc for upright posture and keeping RW close to him  Science writer    Modified Rankin (Stroke Patients Only)       Balance Overall balance assessment: History of Falls                                            Pertinent Vitals/Pain Pain Assessment: No/denies pain    Home Living Family/patient expects to be discharged to:: Skilled nursing facility                 Additional Comments: returning to SNF    Prior Function Level of Independence: Needs assistance   Gait / Transfers Assistance Needed: walks with RW           Hand Dominance   Dominant Hand: Right    Extremity/Trunk Assessment   Upper Extremity Assessment:  (Wife reports RUE weaker due to disuse 2/2 rotator cuff tear)           Lower Extremity Assessment: Overall WFL for tasks assessed      Cervical / Trunk Assessment: Kyphotic (however can reverse/fully extend in supine)  Communication   Communication: No difficulties  Cognition Arousal/Alertness: Awake/alert Behavior During Therapy: WFL for tasks assessed/performed Overall Cognitive Status: History of cognitive impairments - at baseline                      General Comments General comments (skin integrity, edema, etc.): Family present and state he appears to be at his baseline. They plan to take him back to SNF via personal vehicle.    Exercises     Assessment/Plan    PT Assessment All further PT needs  can be met in the next venue of care  PT Problem List Decreased strength;Decreased balance;Decreased mobility;Decreased cognition;Decreased knowledge of use of DME          PT Treatment Interventions      PT Goals (Current goals can be found in the Care Plan section)  Acute Rehab PT Goals Patient Stated Goal: get stronger, not fall PT Goal Formulation: All assessment and education complete, DC therapy    Frequency     Barriers to discharge        Co-evaluation               End of Session Equipment Utilized During Treatment: Gait belt Activity Tolerance: Patient tolerated treatment well Patient left: in bed;with call bell/phone within reach;with bed alarm set;with family/visitor  present Nurse Communication: Mobility status (ok to d/c via vehicle with family)    Functional Assessment Tool Used: clinical judgement  Functional Limitation: Mobility: Walking and moving around Mobility: Walking and Moving Around Current Status (W7371): At least 1 percent but less than 20 percent impaired, limited or restricted Mobility: Walking and Moving Around Goal Status [phone]): At least 1 percent but less than 20 percent impaired, limited or restricted Mobility: Walking and Moving Around Discharge Status [phone]): At least 1 percent but less than 20 percent impaired, limited or restricted    Time: 1316-1336 PT Time Calculation (min) (ACUTE ONLY): 20 min   Charges:   PT Evaluation $PT Eval Low Complexity: 1 Procedure     PT G Codes:   PT G-Codes **NOT FOR INPATIENT CLASS** Functional Assessment Tool Used: clinical judgement  Functional Limitation: Mobility: Walking and moving around Mobility: Walking and Moving Around Current Status (E7035): At least 1 percent but less than 20 percent impaired, limited or restricted Mobility: Walking and Moving Around Goal Status [phone]): At least 1 percent but less than 20 percent impaired, limited or restricted Mobility: Walking and Moving Around Discharge Status [phone]): At least 1 percent but less than 20 percent impaired, limited or restricted    [name] 04/15/2016, 1:53 PM Pager [phone]

## 2016-04-15 NOTE — Progress Notes (Signed)
.   Subjective: Patient is back to his baseline per wife. A & O x 3. Mild persistent right sided facial droop but denies difficulty with speech. No complaints today.    Objective:  Vital signs in last 24 hours: Vitals:   04/14/16 1909 04/14/16 2133 04/15/16 0000 04/15/16 0851  BP:  120/64 (!) 170/63 130/79  Pulse:  73 70 77  Resp:  16  16  Temp:  98.5 F (36.9 C)  98.7 F (37.1 C)  TempSrc:  Oral  Oral  SpO2: 98% 98% 98% 98%  Weight:       Physical Exam Constitutional: NAD, appears comfortable HEENT: Atraumatic, normocephalic. PERRL, anicteric sclera.  Neck: Supple, trachea midline.  Cardiovascular: RRR, no murmurs, rubs, or gallops.  Pulmonary/Chest: CTAB, no wheezes, rales, or rhonchi. No chest wall abnormalities.  Abdominal: Soft, non tender, non distended. +BS.  Extremities: Warm and well perfused. Distal pulses intact. No edema.  Neurologic exam: Mental status: A&Ox3 Cranial Nerves: II: PERRL  III, IV, VI: Extra-occular motions intact bilaterally V, VII: Mild right sided facial droop, sensation intact in all 3 divisions  VIII: hearing normal to rubbing fingers bilaterally  IX, X: palate rises symmetrically XI: Head turn and shoulder shrug normal bilaterally  XII: tongue midline  Motor: Strength 5/5 on all upper and lower extremities, bulk muscle and tone are normal  Deep tendon reflexes absent  Gait:At baseline with walker  Sensory: Light touch intact and symmetric bilaterally  Coordination: There is no dysmetria on finger-to-nose. Rapid alternating movement test normal. Heel to shin test normal. Psychiatric: Normal mood and affect  Assessment/Plan:  Likely TIA: Patient presented with a 1 day hx of difficulty speaking, right facial droop, and right arm weakness. Time of last "normal" state was unknown on presentation. Since admission, his speech has returned and he has no  weakness in his extremities on exam. He does have a slight right-sided facial droop. Given his symptoms were transient this most likely represents a TIA. CT head and MRI were both negative for any acute process. CT Angio head/neck did not show significant atherosclerotic disease. He has been on aspirin daily so can consider Plavix for him as antiplatelet therapy. Will verify with Neurology if they feel Plavix is appropriate. His risk factors for stroke include HTN, hyperlipidemia, and diabetes. Will pursue stroke up and risk factor modification. -- ECHO today  -- Telemetry -- Continue ASA daily, consider Plavix, appreciate neuro recommendations  -- Continue Lipitor 10 mg daily. Would not increase statin dose given his age. Lipid panel within normal limits (Total cholesterol 128, LDL 60) -- A1C 8.1; will recommend outpatient follow up for better glycemic control  -- PT/OT -- Passed RN swallow eval in ED>> Heart healthy diet -- Neuro checks  HTN: BP 167/72 on admission, down to 130/79 today. He takes Losartan 25 mg daily at home. -- Hold home Losartan -- Allow permissive HTN for next 24-48 hours   Type 2 DM: CBG 180 on admission. He is taking Lantus 22 units QHS and Novolog sliding scale at the nursing home. A1C this admission 8.1. -- Continue home Lantus 22 units QHS -- Start sensitive ISS -- Heart healthy diet -- Check CBGs 4 times daily   Hyperlipidemia: Currently on Lipitor 10 mg daily. - Normal lipid panel (Total cholesterol 128, LDL 60) - Continue home Lipitor   Dementia: Currently lives at [facility] facility. He takes Aricept 10 mg QHS.  -- Continue home Aricept   Hx Prostate and Bladder  Cancer: Wife reports patient follows with Dr. Alinda Money with Urology here in [place]. Last follow up one month ago and everything normal per wife.   Dispo: Anticipated discharge today pending ECHO and neurology recommendations.   Velna Ochs, MD 04/15/2016, 9:46 AM Pager:  [phone]

## 2016-04-15 NOTE — Progress Notes (Signed)
Patient will DC to: [facility] Anticipated DC date: 04/15/16 Family notified: Daughter at bedside Transport by: Family by car   Per MD patient ready for DC to [facility]. RN, patient, patient's family, and facility notified of DC. Discharge Summary faxed to facility. RN given number for report.   CSW signing off.  Cedric Fishman, [place] Social Worker [phone]

## 2016-04-15 NOTE — Progress Notes (Signed)
EEG completed, results pending. 

## 2016-04-15 NOTE — Progress Notes (Signed)
PT Cancellation Note  Patient Details Name: [name] MRN: [id number] DOB: [birth year]   Cancelled Treatment:    Reason Eval/Treat Not Completed: Patient at procedure or test/unavailable. Pt gone for echo   [employer] 04/15/2016, 11:26 AM Pager [phone]

## 2016-04-15 NOTE — Progress Notes (Signed)
Subjective: The patient says he is doing well. This was confirmed by the wife. She states that he is at baseline. The patient was told of the plan and looks forward to PT eval and is hopeful for discharge.   Objective: Vital signs in last 24 hours: Vitals:   04/14/16 1909 04/14/16 2133 04/15/16 0000 04/15/16 0851  BP:  120/64 (!) 170/63 130/79  Pulse:  73 70 77  Resp:  16  16  Temp:  98.5 F (36.9 C)  98.7 F (37.1 C)  TempSrc:  Oral  Oral  SpO2: 98% 98% 98% 98%  Weight:       Weight change:   Intake/Output Summary (Last 24 hours) at 04/15/16 1122 Last data filed at 04/15/16 [id number]  Gross per 24 hour  Intake              123 ml  Output              325 ml  Net             -202 ml   Gen: old appearing adult conversant with team, Oriented x 4.  HEENT: atraumatic. Normocephalic. No nasal discharge. Oropharynx moist without lesion. Tongue is unremarkable. PERRL. Anicteric.  Cv: . RRR. Nl S1 S2. III/VI Systolic murmur. no rubs or heaves.  Resp:  NWB. CTAB. Chest expansion symmetric.  Ab:  Non distended. Normal bowel sounds.  Neuro: Oriented x 3. No neck stiffness. EOM intact. Facial sensation is symmetric and intact. Facial movement is intact but asymmetric. Hearing is symmetric. No dysarthria. Uvula and tongue midline. Shoulder and SCM strength 5/5. Strength and sensation throughout in tact and 5/5. Normal finger to nose.  Ext: No obvious rashes.  Psych: normal mood and affect Lab Results: HbA1c 8.1, Lipids Cholesterol 128, Triglycerides 60, HDL 56, LDL 60.  Micro Results: No results found for this or any previous visit (from the past 240 hour(s)). Studies/Results: CT head - 04/14/16 850AMStable advanced atrophy and white matter disease without acute intracranial abnormality or significant interval change.  ASPECTS is 10/10  CT Angio head and neck 0902 04/14/16 - Moderate tortuosity of the distal cervical internal carotid arteries without significant stenosis. This is most often  related to chronic hypertension. Minimal atherosclerotic changes at the left carotid bifurcation without significant stenosis. Atherosclerotic calcifications involving the cavernous internal carotid arteries and vertebral arteries at the dural margins bilaterally. Moderate left vertebral artery narrowing is present. There are no other significant related stenoses at these levels. Normal variant circle of Willis without significant proximal stenosis, aneurysm, or branch vessel occlusion. Enhancing 2.1 cm meningioma adjacent to the right temporal tip.  MR brain - 1439 04/14/16 - Negative for acute infarct. Moderate to advanced atrophy. Chronic microvascular ischemic change 12 x 19 mm meningioma lateral to the right temporal lobe.  Echo  Medications: I have reviewed the patient's current medications. Scheduled Meds: .  stroke: mapping our early stages of recovery book   Does not apply Once  . aspirin EC  81 mg Oral Daily  . atorvastatin  10 mg Oral Daily  . cholecalciferol  1,000 Units Oral Daily  . donepezil  10 mg Oral QHS  . enoxaparin (LOVENOX) injection  40 mg Subcutaneous Q24H  . insulin aspart  0-5 Units Subcutaneous QHS  . insulin aspart  0-9 Units Subcutaneous TID WC  . insulin glargine  22 Units Subcutaneous QHS  . sodium chloride flush  3 mL Intravenous Q12H   Continuous Infusions: PRN Meds:.nystatin cream Assessment/Plan:  Active Problems:   TIA (transient ischemic attack)   [name] is a 82 yo M with a PMH of dementia, HTN, T2DM, HLD, Bladder cancer who presented to the ED with TIA.   TIA - R sided facial droop and R arm weakness is consistent with an ischemia of the R ACA. CT of head showed no acute intracranial process. CTA of head and neck showed some hypertensive induced pathology with some arthrosclerotic changes but no signficant changes. There was a 2.1 cm meningioma that is not consistent with his presentation. MRI of head was negative for an acute infact or intracranial  process, aside from the meningioma. This patient's presentation is most likely due to a TIA. Others to include are migraines, seizures or drugs. Given TIA is most likely and a independent risk factor for stoke. Will admit and observe overnight and start stroke prevent.    - Risk for stroke ABCD: 2-Day Stroke Risk: 4.1% 7-Day Stroke Risk: 5.9% 90-Day Stroke Risk: 9.8%  - swallow eval passed - no need for speech consult  - Neuro checks are unchecked from yesterday  - HbA1c - 8.1 - poor glycemia control   - Lipids - wnl  - echo - Grade 1 diastolic dysfunction, EF [id number]  - Carotids - minimal atherosclerotic changes of carotids  - EEG  - Holter monitor outpatient - per cortical lesion with aphasia so most likely a fib - must rule out  - tele   - Start ASA  325 mg  - Continue home Lipitor 10 mg - PT and OT consult  - Neuro recs much appreciated   T2DM  - A1c - 8.1  - Home lantus 22units qhs  - SSI/CBGs q4  HTN - 160s-170s since admission.   - will hold BP meds until discharge tomorrow  HLD  - lipids within normal   - Lipitor 10 mg  Dementia  -  aricept  FENGI  - HH  - DVT pro: Lovenox 40 mg  Dispo  - Today  This is a Careers information officer Note.  The care of the patient was discussed with Dr. Philipp Ovens and the assessment and plan formulated with their assistance.  Please see their attached note for official documentation of the daily encounter.   LOS: 0 days   Kateland Leisinger F Kimmarie Pascale, Medical Student 04/15/2016, 11:22 AM

## 2016-04-15 NOTE — Discharge Summary (Signed)
Name: [name] MRN: [id number] DOB: 1929/09/10 82 y.o. PCP: Hulan Fess, MD  Date of Admission: 04/14/2016  8:43 AM Date of Discharge: 04/15/2016 Attending Physician: Aldine Contes, MD  Discharge Diagnosis: 1. TIA 2. Poorly controlled type 2 Diabetes Mellitus   Discharge Medications:   Medication List    STOP taking these medications   aspirin 81 MG tablet     TAKE these medications   acetaminophen 325 MG tablet Commonly known as:  TYLENOL Take 650 mg by mouth every 6 (six) hours as needed for mild pain or fever.   alendronate 70 MG tablet Commonly known as:  FOSAMAX Take 70 mg by mouth once a week. Take with a full glass of water on an empty stomach.   atorvastatin 10 MG tablet Commonly known as:  LIPITOR Take 10 mg by mouth daily.   cholecalciferol 1000 units tablet Commonly known as:  VITAMIN D Take 1,000 Units by mouth daily.   clopidogrel 75 MG tablet Commonly known as:  PLAVIX Take 1 tablet (75 mg total) by mouth daily.   donepezil 10 MG tablet Commonly known as:  ARICEPT Take 10 mg by mouth at bedtime.   insulin aspart 100 UNIT/ML injection Commonly known as:  novoLOG Inject 0-11 Units into the skin 3 (three) times daily before meals. Sliding scale   insulin glargine 100 UNIT/ML injection Commonly known as:  LANTUS Inject 22 Units into the skin daily.   LANTUS SOLOSTAR [place] Inject 10 Units into the skin every morning.   losartan 25 MG tablet Commonly known as:  COZAAR Take 25 mg by mouth daily.   nystatin cream Commonly known as:  MYCOSTATIN Apply 1 application topically 2 (two) times daily as needed for dry skin.       Disposition and follow-up:   [name] was discharged from [facility] in Stable condition.  At the hospital follow up visit please address:  1.  TIA - Patient presented from SNF with acute onset R facial droop and R arm weakness the morning of admission. He was nonverbal but could follow  commands. CT head showed no acute bleed or infarct. MRI did show benign meningioma, non suspected to cause presentation. By the afternoon of admission, symptoms had improved and the patient returned to baseline with only some residual facial asymmetry, otherwise no deficits. Patient passed Pt, Ot, Swallowng evaluations. CTA of head and neck showed mild arthrosclerosis of carotids. Echoshowed grade 1 diastolic dysfunction and a EF of 60-65%. Patient was given ASA 325 mg in the ED. Risk factor work up included: A1c was 8.1 Patient takes lipitor 10mg  at home and Lipid panel was normal (Cholesterol 128, Triglycerides 60, HDL 56, LDL 60). Patient's wife reports poor glycemic control with patient non-compliance to glucose checks. Wife also reports non adherence to walker use at night and frequent recurrent falls. Please assess glycemic control and adherence to medications/glucose checks. Please assess adherence to walker use especially at night as patient is at an increased risk for bleeding with Plavix.    - HTN - Takes losartan 25 daily and reports compliance. Patient initially presented to the ED with BPs in 160s/70s. Home BP meds were withheld to allow permissive HTN. Patient BP was labile through stay ranging from 130s-180s/60s-70s. Instructed to restart home BP meds tomorrow. Please follow up and manage as appropriate.    - Dementia - Per wife, patient was back to baseline on discharge. A&O x 3, mild short term memory deficit.    -  HLD - reports compliance with home lipitor 10 mg daily. Lipids this admission were normal (Cholesterol 128, Triglycerides 60, HDL 56, LDL 60).    - T2DM - A1c this admission 8.1. Patient's wife reports poor glycemic control with recent changes in home regimen. Wife reports patient is often non-compliant with glucose monitoring. Please assess glycemic control and adherence to medications/glucose checks. Recommend optimization of glycemic control for stroke prevention.   2.  Labs  / imaging needed at time of follow-up: 24-hour Holter monitor   3.  Pending labs/ test needing follow-up: EEG   Follow-up Appointments:   Hospital Course by problem list: Active Problems:   TIA (transient ischemic attack)   1. TIA - [name] is a 82 yo M with a PMH of dementia, HTN, T2DM, HLD, Bladder and prostate cancer who presented to the ED with R sided facial weakness and dysarthria. Per EMS the patient exhibited right facial droop and right arm weakness. In the Ed, the patient was afebrile at 97.6, HR of 68, RR of 14, BP of 167/72, O2 of 98 on Ra. Exam was notable for no spontaneous movement of right extremities other then toes. He was non verbal on presentation but able to follow commands. NIH stroke scale initially of 8 but improved to 3 later that day. CT of head showed no intracranial bleed. CTA of head and neck showed mild arthrosclerosis of carotids.  ECG was NSR. Labs were drawn: CBC diff was wnl. CMP was wnl. Trop was negative. MRI showed no acute infarction but a benign 2.1 cm meningioma. Patient passed swallowing screen. ASA 325 mg was administered. Patient was evaluated by neurology and suggested risk reduction and switch from ASA 81mg  to Plavix 75 mg given no evidence of significant atherosclerosis and event currently on ASA. Patient passed Pt, Ot, Swallowng evaluations. Echo showed grade 1 diastolic dysfunction and a EF of 60-65%.  A1c was 8.1 Patient reported compliance with his home lipitor and lipid panel this admission was normal (Cholesterol 128, Triglycerides 60, HDL 56, LDL 60). EEG read was pending on discharge. On discharge, patient's deficits had improved with only mild residual right sided facial droop. Per wife he was back to his baseline, aphasia resolved. He was discharged with plans for a Holter monitor to evaluate for any arrhythmia per neurology recommendations.   2.  HTN - takes losartan 25 mg daily at home, reports compliance. Patient initially presented to the ED  with BPs in 160s/70s. BP meds were withheld to promote perfusion and allow permissive HTN. Patient BP was labile through stay ranging from 130s-180s/60s-70s. Patient was instructed to restart home losartan the day after discharge.   3.  Dementia - Home Aricept was continued. At baseline on discharge.   4. T2DM - CBG of 198 on admission. Was maintained on sliding scale and home Lantus 22 units qhs. A1c was 8.1. Discharged on home regimen, recommend better glycemic control for stroke prevention.   Discharge Vitals:   BP 130/79 (BP Location: Right Arm)   Pulse 77   Temp 98.7 F (37.1 C) (Oral)   Resp 16   Wt 151 lb 3.8 oz (68.6 kg)   SpO2 98%   BMI 21.70 kg/m   Pertinent Labs, Studies, and Procedures:  CT head - 04/14/16 850AMStable advanced atrophy and white matter disease without acute intracranial abnormality or significant interval change.  ASPECTS is 10/10  CT Angio head and neck 0902 04/14/16 - Moderate tortuosity of the distal cervical internal carotid arteries without significant  stenosis. This is most often related to chronic hypertension. Minimal atherosclerotic changes at the left carotid bifurcation without significant stenosis. Atherosclerotic calcifications involving the cavernous internal carotid arteries and vertebral arteries at the dural margins bilaterally. Moderate left vertebral artery narrowing is present. There are no other significant related stenoses at these levels. Normal variant circle of Willis without significant proximal stenosis, aneurysm, or branch vessel occlusion. Enhancing 2.1 cm meningioma adjacent to the right temporal tip.  MR brain - 1439 04/14/16 - Negative for acute infarct. Moderate to advanced atrophy. Chronic microvascular ischemic change 12 x 19 mm meningioma lateral to the right temporal lobe.  ECG - NSR  CBC was notable for hgb of 12.8. A1c of 8.1 Trops negative. Lipids were Cholesterol 128, Triglycerides 60, HDL 56, LDL 60.   EEG - Read  pending on discharge  Echo - Echo showed grade 1 diastolic dysfunction and a EF of 60-65%.  Discharge Instructions: Discharge Instructions    Call MD for:    Complete by:  As directed    Numbness, weakness, facial droop, slurred speech, or difficulty speaking.   Diet - low sodium heart healthy    Complete by:  As directed    Discharge instructions    Complete by:  As directed    Please stop taking your daily Aspirin. We have started you on another medicine called Plavix that you should take daily instead. This medicine will help prevent future strokes. Please do not take your blood pressure medicine (losartan) today. You may restart it tomorrow. You may continue taking your other medicines as previously prescribed. It is important that you call your primary care doctor to schedule a hospital follow up appointment in the next 2 weeks. If your symptoms should recur, please return to the ED for evaluation. It was a pleasure taking care of you. Thank you!   Increase activity slowly    Complete by:  As directed       Signed: Velna Ochs, MD 04/15/2016, 2:47 PM   Pager: [phone]

## 2016-04-15 NOTE — Progress Notes (Signed)
  Date: 04/15/2016  Patient name: [name]  Medical record number: [id number]  Date of birth: 02/14/1930   I have seen and evaluated Lurline Hare and discussed their care with the Residency Team. In brief, patient is an 82 year old male with a past medical history of hypertension, hyperlipidemia, type II DM and dementia who presented with an episode of inability to speak. Patient lives in a facility and his wife was notified at approximately 8 AM yesterday that he was unable to speak and appeared confused. Patient was able to follow directions but was unable to get words out. He was also noted to have an associated right facial droop and right arm weakness by EMS. Patient was transported to the emergency department for further workup. Patient's symptoms improved while he was in the emergency department and is currently back to baseline per his wife. No chest pain, no shortness of breath, no diaphoresis, no headache, no blurred vision.  Patient feels well today with no new complaints and is back to baseline per his wife.  PMHx, Fam Hx, and/or Soc Hx : As per resident admit note  Vitals:   04/15/16 0000 04/15/16 0851  BP: (!) 170/63 130/79  Pulse: 70 77  Resp:  16  Temp:  98.7 F (37.1 C)   Gen.: Awake alert and oriented 3, NAD CVS: Regular rate and rhythm, normal heart sounds Lungs: Clear to auscultation bilaterally Abdomen: Soft, nontender, normal bowel sounds Extremities: No edema Neuro: No focal neurological deficits noted on exam, patient with mild right-sided facial droop  Assessment and Plan: I have seen and evaluated the patient as outlined above. I agree with the formulated Assessment and Plan as detailed in the residents' note, with the following changes:   1. Likely TIA: - Patient presented with difficulty talking and right-sided facial droop as well as possible right upper extremity weakness yesterday morning. He improved rapidly in the emergency department and is  currently at baseline per his wife. Still with mild right-sided facial droop. His symptoms are likely secondary to a TIA especially given his risk factors including hypertension and diabetes. CT head and MRI showed no evidence for acute CVA. CT angiogram head and neck did not show significant atherosclerotic disease. - We'll follow-up results of echo today - We will continue with aspirin for now. Stroke team to follow up today. Would consider changing his antiplatelet regimen to Plavix and that he has been on aspirin at home and still presented with these symptoms. - PT OT speech eval pending. We'll follow-up - We'll hold antihypertensive medications for now and allow for permissive hypertension for the next 24-48 hours - If workup remains negative patient will likely be discharged back to facility today  Aldine Contes, MD 12/9/201712:23 PM

## 2016-04-15 NOTE — Progress Notes (Signed)
Pt being discharged back to [facility] per orders from MD. Pt and family educated on discharge instructions. Pt and family verbalized understanding of instructions. All questions and concerns were addressed prior to discharge. All pt's IV's were removed prior to discharge. Pt exited hospital via wheelchair.

## 2016-04-15 NOTE — Progress Notes (Signed)
OT Cancellation Note  Patient Details Name: [name] MRN: [id number] DOB: [birth year]   Cancelled Treatment:    Reason Eval/Treat Not Completed: OT screened, no needs identified, will sign off. Per communication with PT, pt, and family pt is at baseline with ADLs. No OT needs indicated at this time. OT signing off.   Hortencia Pilar 04/15/2016, 2:42 PM

## 2016-04-17 DIAGNOSIS — E039 Hypothyroidism, unspecified: Secondary | ICD-10-CM | POA: Diagnosis not present

## 2016-04-17 DIAGNOSIS — D649 Anemia, unspecified: Secondary | ICD-10-CM | POA: Diagnosis not present

## 2016-04-17 DIAGNOSIS — E119 Type 2 diabetes mellitus without complications: Secondary | ICD-10-CM | POA: Diagnosis not present

## 2016-04-17 DIAGNOSIS — E559 Vitamin D deficiency, unspecified: Secondary | ICD-10-CM | POA: Diagnosis not present

## 2016-04-17 DIAGNOSIS — F0390 Unspecified dementia without behavioral disturbance: Secondary | ICD-10-CM | POA: Diagnosis not present

## 2016-04-17 DIAGNOSIS — I10 Essential (primary) hypertension: Secondary | ICD-10-CM | POA: Diagnosis not present

## 2016-04-17 DIAGNOSIS — E1142 Type 2 diabetes mellitus with diabetic polyneuropathy: Secondary | ICD-10-CM | POA: Diagnosis not present

## 2016-04-18 DIAGNOSIS — Z9181 History of falling: Secondary | ICD-10-CM | POA: Diagnosis not present

## 2016-04-18 DIAGNOSIS — E785 Hyperlipidemia, unspecified: Secondary | ICD-10-CM | POA: Diagnosis not present

## 2016-04-18 DIAGNOSIS — F0390 Unspecified dementia without behavioral disturbance: Secondary | ICD-10-CM | POA: Diagnosis not present

## 2016-04-18 DIAGNOSIS — R279 Unspecified lack of coordination: Secondary | ICD-10-CM | POA: Diagnosis not present

## 2016-04-18 DIAGNOSIS — E1165 Type 2 diabetes mellitus with hyperglycemia: Secondary | ICD-10-CM | POA: Diagnosis not present

## 2016-04-18 DIAGNOSIS — L239 Allergic contact dermatitis, unspecified cause: Secondary | ICD-10-CM | POA: Diagnosis not present

## 2016-04-18 DIAGNOSIS — I10 Essential (primary) hypertension: Secondary | ICD-10-CM | POA: Diagnosis not present

## 2016-04-18 DIAGNOSIS — G459 Transient cerebral ischemic attack, unspecified: Secondary | ICD-10-CM | POA: Diagnosis not present

## 2016-04-19 DIAGNOSIS — Z9181 History of falling: Secondary | ICD-10-CM | POA: Diagnosis not present

## 2016-04-19 DIAGNOSIS — F0390 Unspecified dementia without behavioral disturbance: Secondary | ICD-10-CM | POA: Diagnosis not present

## 2016-04-19 DIAGNOSIS — R279 Unspecified lack of coordination: Secondary | ICD-10-CM | POA: Diagnosis not present

## 2016-04-20 DIAGNOSIS — F0390 Unspecified dementia without behavioral disturbance: Secondary | ICD-10-CM | POA: Diagnosis not present

## 2016-04-20 DIAGNOSIS — Z9181 History of falling: Secondary | ICD-10-CM | POA: Diagnosis not present

## 2016-04-20 DIAGNOSIS — R279 Unspecified lack of coordination: Secondary | ICD-10-CM | POA: Diagnosis not present

## 2016-04-21 DIAGNOSIS — R279 Unspecified lack of coordination: Secondary | ICD-10-CM | POA: Diagnosis not present

## 2016-04-21 DIAGNOSIS — F0390 Unspecified dementia without behavioral disturbance: Secondary | ICD-10-CM | POA: Diagnosis not present

## 2016-04-21 DIAGNOSIS — Z9181 History of falling: Secondary | ICD-10-CM | POA: Diagnosis not present

## 2016-04-24 DIAGNOSIS — R279 Unspecified lack of coordination: Secondary | ICD-10-CM | POA: Diagnosis not present

## 2016-04-24 DIAGNOSIS — F0390 Unspecified dementia without behavioral disturbance: Secondary | ICD-10-CM | POA: Diagnosis not present

## 2016-04-24 DIAGNOSIS — Z9181 History of falling: Secondary | ICD-10-CM | POA: Diagnosis not present

## 2016-04-25 DIAGNOSIS — F0390 Unspecified dementia without behavioral disturbance: Secondary | ICD-10-CM | POA: Diagnosis not present

## 2016-04-25 DIAGNOSIS — Z9181 History of falling: Secondary | ICD-10-CM | POA: Diagnosis not present

## 2016-04-25 DIAGNOSIS — R279 Unspecified lack of coordination: Secondary | ICD-10-CM | POA: Diagnosis not present

## 2016-04-26 DIAGNOSIS — R279 Unspecified lack of coordination: Secondary | ICD-10-CM | POA: Diagnosis not present

## 2016-04-26 DIAGNOSIS — F0390 Unspecified dementia without behavioral disturbance: Secondary | ICD-10-CM | POA: Diagnosis not present

## 2016-04-26 DIAGNOSIS — Z9181 History of falling: Secondary | ICD-10-CM | POA: Diagnosis not present

## 2016-04-27 DIAGNOSIS — Z9181 History of falling: Secondary | ICD-10-CM | POA: Diagnosis not present

## 2016-04-27 DIAGNOSIS — R279 Unspecified lack of coordination: Secondary | ICD-10-CM | POA: Diagnosis not present

## 2016-04-27 DIAGNOSIS — F0390 Unspecified dementia without behavioral disturbance: Secondary | ICD-10-CM | POA: Diagnosis not present

## 2016-04-28 DIAGNOSIS — F0390 Unspecified dementia without behavioral disturbance: Secondary | ICD-10-CM | POA: Diagnosis not present

## 2016-04-28 DIAGNOSIS — R279 Unspecified lack of coordination: Secondary | ICD-10-CM | POA: Diagnosis not present

## 2016-04-28 DIAGNOSIS — Z9181 History of falling: Secondary | ICD-10-CM | POA: Diagnosis not present

## 2016-05-02 DIAGNOSIS — E1142 Type 2 diabetes mellitus with diabetic polyneuropathy: Secondary | ICD-10-CM | POA: Diagnosis not present

## 2016-05-02 DIAGNOSIS — Z794 Long term (current) use of insulin: Secondary | ICD-10-CM | POA: Diagnosis not present

## 2016-05-02 DIAGNOSIS — E1165 Type 2 diabetes mellitus with hyperglycemia: Secondary | ICD-10-CM | POA: Diagnosis not present

## 2016-06-01 DIAGNOSIS — I679 Cerebrovascular disease, unspecified: Secondary | ICD-10-CM | POA: Diagnosis not present

## 2016-06-01 DIAGNOSIS — E785 Hyperlipidemia, unspecified: Secondary | ICD-10-CM | POA: Diagnosis not present

## 2016-06-01 DIAGNOSIS — F0390 Unspecified dementia without behavioral disturbance: Secondary | ICD-10-CM | POA: Diagnosis not present

## 2016-06-01 DIAGNOSIS — E118 Type 2 diabetes mellitus with unspecified complications: Secondary | ICD-10-CM | POA: Diagnosis not present

## 2016-06-01 DIAGNOSIS — I10 Essential (primary) hypertension: Secondary | ICD-10-CM | POA: Diagnosis not present

## 2016-06-12 ENCOUNTER — Other Ambulatory Visit: Payer: Self-pay | Admitting: *Deleted

## 2016-06-12 ENCOUNTER — Encounter: Payer: Self-pay | Admitting: Nurse Practitioner

## 2016-06-12 ENCOUNTER — Ambulatory Visit (INDEPENDENT_AMBULATORY_CARE_PROVIDER_SITE_OTHER): Payer: Medicare Other | Admitting: Nurse Practitioner

## 2016-06-12 VITALS — BP 124/70 | HR 78 | Ht 70.0 in | Wt 159.6 lb

## 2016-06-12 DIAGNOSIS — I10 Essential (primary) hypertension: Secondary | ICD-10-CM

## 2016-06-12 DIAGNOSIS — G459 Transient cerebral ischemic attack, unspecified: Secondary | ICD-10-CM

## 2016-06-12 DIAGNOSIS — E785 Hyperlipidemia, unspecified: Secondary | ICD-10-CM | POA: Diagnosis not present

## 2016-06-12 DIAGNOSIS — G451 Carotid artery syndrome (hemispheric): Secondary | ICD-10-CM | POA: Diagnosis not present

## 2016-06-12 DIAGNOSIS — R269 Unspecified abnormalities of gait and mobility: Secondary | ICD-10-CM | POA: Diagnosis not present

## 2016-06-12 NOTE — Progress Notes (Signed)
[facility]  PATIENT: [name] DOB: Dec 22, 1929   REASON FOR VISIT: Hospital follow-up for left brain TIA suspect cardioembolic phenomena HISTORY FROM: Patient and wife    HISTORY OF PRESENT ILLNESS: Mr. Brisk, 83 year old male returns for follow-up after admission to the hospital in December for right-sided weakness and aphasia. He woke up that morning to say a few words but was groggy. In addition he had a right arm weakness. In the emergency room he was able to answer yes no questions. His aphasia resolved within one hour. MRI of the brain was negative for acute stroke CTA head and neck no significant stenosis of mild bilateral atherosclerosis. 2-D echo 60-65% EF. LDL 60 hemoglobin A1c 8.1. He was on aspirin 0.81 prior to admission and switched  to Plavix. He currently resides at [facility] in the memory unit. His wife has an apartment in assisted living at the same facility. He returns for stroke follow up today in the clinic. He remains on Plavix without bruising and bleeding. He has not had further stroke or TIA symptoms. He has had some adjustments to his insulin and his most recent hemoglobin A1c according to the wife is 7. He remains on Lipitor without complaints of myalgias. Blood pressure well controlled today at 124/70 It was recommended that he get 30 day event monitor to rule out atrial fibrillation. This has not been set up. He returns for reevaluation   REVIEW OF SYSTEMS: Full 14 system review of systems performed and notable only for those listed, all others are neg:  Constitutional: neg  Cardiovascular: neg Ear/Nose/Throat: neg  Skin: neg Eyes: neg Respiratory: neg Gastroitestinal: neg  Hematology/Lymphatic: neg  Endocrine: neg Musculoskeletal: Walking difficulty Allergy/Immunology: neg Neurological: Memory loss, left brain TIA Psychiatric: neg Sleep : neg   ALLERGIES: Allergies  Allergen Reactions  . Actos [Pioglitazone]     Rash       HOME MEDICATIONS: Outpatient Medications Prior to Visit  Medication Sig Dispense Refill  . acetaminophen (TYLENOL) 325 MG tablet Take 650 mg by mouth every 6 (six) hours as needed for mild pain or fever.    Marland Kitchen alendronate (FOSAMAX) 70 MG tablet Take 70 mg by mouth once a week. Take with a full glass of water on an empty stomach.    Marland Kitchen atorvastatin (LIPITOR) 10 MG tablet Take 10 mg by mouth daily.    . cholecalciferol (VITAMIN D) 1000 units tablet Take 1,000 Units by mouth daily.    . clopidogrel (PLAVIX) 75 MG tablet Take 1 tablet (75 mg total) by mouth daily. 30 tablet 2  . donepezil (ARICEPT) 10 MG tablet Take 10 mg by mouth at bedtime.    . insulin aspart (NOVOLOG) 100 UNIT/ML injection Inject 0-11 Units into the skin 3 (three) times daily before meals. Sliding scale    . insulin glargine (LANTUS) 100 UNIT/ML injection Inject 32 Units into the skin daily.     Marland Kitchen losartan (COZAAR) 25 MG tablet Take 25 mg by mouth daily.    Marland Kitchen nystatin cream (MYCOSTATIN) Apply 1 application topically 2 (two) times daily as needed for dry skin.    . Insulin Glargine (LANTUS SOLOSTAR [place]) Inject 10 Units into the skin every morning.      No facility-administered medications prior to visit.     PAST MEDICAL HISTORY: Past Medical History:  Diagnosis Date  . Bladder cancer ([place])   . Dementia   . Diabetes mellitus without complication ([place])   . Hyperlipidemia   .  Hypertension   . Osteoporosis   . Prostate CA ([place])    HISTORY OF    PAST SURGICAL HISTORY: Past Surgical History:  Procedure Laterality Date  . PROSTATE SURGERY    . rotator cuff surgery    . TONSILLECTOMY      FAMILY HISTORY: No family history on file.  SOCIAL HISTORY: Social History   Social History  . Marital status: Married    Spouse name: N/A  . Number of children: N/A  . Years of education: N/A   Occupational History  . Not on file.   Social History Main Topics  . Smoking status: Never Smoker  . Smokeless tobacco:  Never Used  . Alcohol use No  . Drug use: No  . Sexual activity: Not on file   Other Topics Concern  . Not on file   Social History Narrative  . No narrative on file     PHYSICAL EXAM  Vitals:   06/12/16 1424  BP: 124/70  Pulse: 78  Weight: 159 lb 9.6 oz (72.4 kg)  Height: 5\' 10"  (1.778 m)   Body mass index is 22.9 kg/m.  Generalized: Well developed, in no acute distress  Head: normocephalic and atraumatic,. Oropharynx benign  Neck: Supple, no carotid bruits  Cardiac: Regular rate rhythm, no murmur  Musculoskeletal: No deformity   Neurological examination   Mentation: Alert oriented to time, place, not the year, wife provides  History.  Follows all commands speech and language fluent.   Cranial nerve II-XII: Pupils were equal round reactive to light extraocular movements were full, visual field were full on confrontational test. Facial sensation and strength were normal. hearing was intact to finger rubbing bilaterally. Uvula tongue midline. head turning and shoulder shrug were normal and symmetric.Tongue protrusion into cheek strength was normal. Motor: normal bulk and tone, full strength in the BUE, BLE, fine finger movements normal, no pronator drift. No focal weakness Sensory: normal and symmetric to light touch, pinprick, and  Vibration, in the upper and lower extremities Coordination: finger-nose-finger, heel-to-shin bilaterally, no dysmetria Reflexes: 1+ upper lower and symmetric plantar responses were flexor bilaterally. Gait and Station: Rising up from seated position without assistance, slightly stooped posture, ambulates with a rolling walker, no difficulty with turns   DIAGNOSTIC DATA (LABS, IMAGING, TESTING) - I reviewed patient records, labs, notes, testing and imaging myself where available.  Lab Results  Component Value Date   WBC 6.4 04/14/2016   HGB 12.9 (L) 04/14/2016   HCT 38.0 (L) 04/14/2016   MCV 93.0 04/14/2016   PLT 210 04/14/2016        Component Value Date/Time   NA 140 04/14/2016 0852   K 3.9 04/14/2016 0852   CL 103 04/14/2016 0852   CO2 26 04/14/2016 0846   GLUCOSE 190 (H) 04/14/2016 0852   BUN 18 04/14/2016 0852   CREATININE 1.00 04/14/2016 0852   CALCIUM 9.2 04/14/2016 0846   PROT 6.2 (L) 04/14/2016 0846   ALBUMIN 3.8 04/14/2016 0846   AST 27 04/14/2016 0846   ALT 37 04/14/2016 0846   ALKPHOS 41 04/14/2016 0846   BILITOT 0.7 04/14/2016 0846   GFRNONAA >60 04/14/2016 0846   GFRAA >60 04/14/2016 0846   Lab Results  Component Value Date   CHOL 128 04/14/2016   HDL 56 04/14/2016   LDLCALC 60 04/14/2016   TRIG 60 04/14/2016   CHOLHDL 2.3 04/14/2016   Lab Results  Component Value Date   HGBA1C 8.1 (H) 04/14/2016    ASSESSMENT AND PLAN  83 y.o. year old male  has a past medical history of Dementia; Diabetes mellitus without complication ([place]); Hyperlipidemia; Hypertension; And recent left brain TIA here to follow-up in stroke clinic  PLAN: Stressed the importance of management of risk factors to prevent further stroke Continue Plavix for secondary stroke prevention Will set up for 30 day event monitoring to rule out atrial fibrillation Maintain strict control of hypertension with blood pressure goal below 130/90, today's reading 124/70  continue antihypertensive medications Control of diabetes with hemoglobin A1c below 6.5 followed by primary care most recent hemoglobin A1c8.1 continue diabetic medications, insulin Cholesterol with LDL cholesterol less than 70, followed by primary care,  Continue  Lipitor Use walker at all times for safe ambulation at risk for falls Exercise by walking, slowly increase , eat healthy diet with whole grains,  fresh fruits and vegetables Discussed risk for recurrent stroke/ TIA and answered additional questions This was a  visit requiring 87minutes of  medical decision making of high complexity with extensive review of history, hospital chart, counseling and answering  questions Dennie Bible, [facility], [facility], APRN  [facility] [address], [place], [place] [address] [phone]

## 2016-06-12 NOTE — Patient Instructions (Signed)
Stressed the importance of management of risk factors to prevent further stroke Continue Plavixfor secondary stroke prevention Will set up for 30 day event monitoring to rule out atrial fibrillation Maintain strict control of hypertension with blood pressure goal below 130/90, today's reading 124/70  continue antihypertensive medications Control of diabetes with hemoglobin A1c below 6.5 followed by primary care most recent hemoglobin A1c8.1 continue diabetic medications Cholesterol with LDL cholesterol less than 70, followed by primary care,  statin drugs Lipitor Exercise by walking, slowly increase , eat healthy diet with whole grains,  fresh fruits and vegetables Discussed risk for recurrent stroke/ TIA and answered additional questions

## 2016-06-14 DIAGNOSIS — L570 Actinic keratosis: Secondary | ICD-10-CM | POA: Diagnosis not present

## 2016-06-14 DIAGNOSIS — X32XXXD Exposure to sunlight, subsequent encounter: Secondary | ICD-10-CM | POA: Diagnosis not present

## 2016-06-14 DIAGNOSIS — C4442 Squamous cell carcinoma of skin of scalp and neck: Secondary | ICD-10-CM | POA: Diagnosis not present

## 2016-06-15 NOTE — Progress Notes (Signed)
I reviewed above note and agree with the assessment and plan.  Rosalin Hawking, MD PhD Stroke Neurology 06/15/2016 9:24 PM

## 2016-06-20 ENCOUNTER — Other Ambulatory Visit: Payer: Self-pay | Admitting: Nurse Practitioner

## 2016-06-20 ENCOUNTER — Ambulatory Visit (INDEPENDENT_AMBULATORY_CARE_PROVIDER_SITE_OTHER): Payer: Medicare Other

## 2016-06-20 DIAGNOSIS — I4891 Unspecified atrial fibrillation: Secondary | ICD-10-CM

## 2016-06-20 DIAGNOSIS — G459 Transient cerebral ischemic attack, unspecified: Secondary | ICD-10-CM | POA: Diagnosis not present

## 2016-06-29 ENCOUNTER — Emergency Department (HOSPITAL_COMMUNITY)
Admission: EM | Admit: 2016-06-29 | Discharge: 2016-06-30 | Disposition: A | Discharge status: Home / Self Care | Payer: Medicare Other | Attending: Emergency Medicine | Admitting: Emergency Medicine

## 2016-06-29 ENCOUNTER — Emergency Department (HOSPITAL_COMMUNITY): Payer: Medicare Other

## 2016-06-29 ENCOUNTER — Encounter (HOSPITAL_COMMUNITY): Payer: Self-pay

## 2016-06-29 DIAGNOSIS — Z8546 Personal history of malignant neoplasm of prostate: Secondary | ICD-10-CM | POA: Diagnosis not present

## 2016-06-29 DIAGNOSIS — Z8551 Personal history of malignant neoplasm of bladder: Secondary | ICD-10-CM | POA: Insufficient documentation

## 2016-06-29 DIAGNOSIS — R319 Hematuria, unspecified: Secondary | ICD-10-CM

## 2016-06-29 DIAGNOSIS — E119 Type 2 diabetes mellitus without complications: Secondary | ICD-10-CM | POA: Insufficient documentation

## 2016-06-29 DIAGNOSIS — N200 Calculus of kidney: Secondary | ICD-10-CM | POA: Insufficient documentation

## 2016-06-29 DIAGNOSIS — R1030 Lower abdominal pain, unspecified: Secondary | ICD-10-CM | POA: Diagnosis not present

## 2016-06-29 DIAGNOSIS — R310 Gross hematuria: Secondary | ICD-10-CM | POA: Diagnosis not present

## 2016-06-29 DIAGNOSIS — I10 Essential (primary) hypertension: Secondary | ICD-10-CM | POA: Diagnosis not present

## 2016-06-29 DIAGNOSIS — N210 Calculus in bladder: Secondary | ICD-10-CM | POA: Diagnosis not present

## 2016-06-29 LAB — COMPREHENSIVE METABOLIC PANEL
ALT: 32 U/L (ref 17–63)
ANION GAP: 10 (ref 5–15)
AST: 27 U/L (ref 15–41)
Albumin: 4.3 g/dL (ref 3.5–5.0)
Alkaline Phosphatase: 51 U/L (ref 38–126)
BUN: 21 mg/dL — ABNORMAL HIGH (ref 6–20)
CHLORIDE: 103 mmol/L (ref 101–111)
CO2: 23 mmol/L (ref 22–32)
CREATININE: 1.02 mg/dL (ref 0.61–1.24)
Calcium: 9.3 mg/dL (ref 8.9–10.3)
GFR calc non Af Amer: >60 mL/min (ref >60)
Glucose, Bld: 268 mg/dL — ABNORMAL HIGH (ref 65–99)
Potassium: 4.3 mmol/L (ref 3.5–5.1)
SODIUM: 136 mmol/L (ref 135–145)
Total Bilirubin: 0.5 mg/dL (ref 0.3–1.2)
Total Protein: 7.5 g/dL (ref 6.5–8.1)

## 2016-06-29 LAB — URINALYSIS, MICROSCOPIC (REFLEX): BACTERIA UA: NONE SEEN

## 2016-06-29 LAB — CBC WITH DIFFERENTIAL/PLATELET
Basophils Absolute: 0 10*3/uL (ref 0.0–0.1)
Basophils Relative: 0 %
Eosinophils Absolute: 0 10*3/uL (ref 0.0–0.7)
Eosinophils Relative: 0 %
HEMATOCRIT: 38.9 % — AB (ref 39.0–52.0)
HEMOGLOBIN: 13.4 g/dL (ref 13.0–17.0)
LYMPHS ABS: 0.6 10*3/uL — AB (ref 0.7–4.0)
LYMPHS PCT: 5 %
MCH: 31 pg (ref 26.0–34.0)
MCHC: 34.4 g/dL (ref 30.0–36.0)
MCV: 90 fL (ref 78.0–100.0)
Monocytes Absolute: 0.5 10*3/uL (ref 0.1–1.0)
Monocytes Relative: 4 %
NEUTROS ABS: 10.6 10*3/uL — AB (ref 1.7–7.7)
NEUTROS PCT: 91 %
Platelets: 228 10*3/uL (ref 150–400)
RBC: 4.32 MIL/uL (ref 4.22–5.81)
RDW: 12.8 % (ref 11.5–15.5)
WBC: 11.7 10*3/uL — AB (ref 4.0–10.5)

## 2016-06-29 LAB — URINALYSIS, ROUTINE W REFLEX MICROSCOPIC

## 2016-06-29 LAB — LIPASE, BLOOD: Lipase: 22 U/L (ref 11–51)

## 2016-06-29 MED ORDER — ONDANSETRON HCL 4 MG PO TABS: 4.0000 mg | ORAL_TABLET | Freq: Four times a day (QID) | ORAL | 0 refills | Status: AC

## 2016-06-29 MED ORDER — TAMSULOSIN HCL 0.4 MG PO CAPS: 0.4000 mg | ORAL_CAPSULE | Freq: Every day | ORAL | 0 refills | Status: AC

## 2016-06-29 NOTE — ED Provider Notes (Signed)
Emergency Department Provider Note   I have reviewed the triage vital signs and the nursing notes.   HISTORY  Chief Complaint Emesis; Abdominal Pain; and Hematuria   HPI [name] is a 83 y.o. male with PMH of bladder cancer, DM, HLD, and HTN [place] to the emergency department for evaluation of hematuria with 2 episodes of emesis. Family states that symptoms began approximately 2 PM today. No associated fever, abdominal pain, back pain. The patient does have a history of kidney stones but states this feels different. He is followed by urology with history of both prostate and bladder cancer, both of which are in remission. He denies any dysuria, hesitancy, urgency. Symptoms are gradually worsening with no modifying factors. No symptoms of urinary retention.    Past Medical History:  Diagnosis Date  . Bladder cancer ([place])   . Dementia   . Diabetes mellitus without complication ([place])   . Hyperlipidemia   . Hypertension   . Osteoporosis   . Prostate CA [facility])    HISTORY OF    Patient Active Problem List   Diagnosis Date Noted  . HTN (hypertension) 06/12/2016  . Hyperlipemia 06/12/2016  . Abnormality of gait 06/12/2016  . Aphasia   . TIA (transient ischemic attack) 04/14/2016    Past Surgical History:  Procedure Laterality Date  . PROSTATE SURGERY    . rotator cuff surgery    . TONSILLECTOMY      Current Outpatient Rx  . Order #: [id number] Class: Historical Med  . Order #: [id number] Class: Historical Med  . Order #: [id number] Class: Historical Med  . Order #: [id number] Class: Historical Med  . Order #: [id number] Class: Normal  . Order #: [id number] Class: Historical Med  . Order #: [id number] Class: Historical Med  . Order #: [id number] Class: Historical Med  . Order #: [id number] Class: Historical Med  . Order #: [id number] Class: Historical Med  . Order #: [id number] Class: Print  . Order #: [id number] Class: Print    Allergies Actos [pioglitazone]  History reviewed. No  pertinent family history.  Social History Social History  Substance Use Topics  . Smoking status: Never Smoker  . Smokeless tobacco: Never Used  . Alcohol use No    Review of Systems  Constitutional: No fever/chills Eyes: No visual changes. ENT: No sore throat. Cardiovascular: Denies chest pain. Respiratory: Denies shortness of breath. Gastrointestinal: No abdominal pain. Positive nausea and vomiting.  No diarrhea.  No constipation. Genitourinary: Negative for dysuria. Positive gross hematuria.  Musculoskeletal: Negative for back pain. Skin: Negative for rash. Neurological: Negative for headaches, focal weakness or numbness.  10-point ROS otherwise negative.  ____________________________________________   PHYSICAL EXAM:  VITAL SIGNS: ED Triage Vitals  Enc Vitals Group     BP 06/29/16 2044 175/76     Pulse Rate 06/29/16 2044 68     Resp 06/29/16 2044 20     Temp 06/29/16 2044 98.2 F (36.8 C)     Temp Source 06/29/16 2044 Oral     SpO2 06/29/16 2044 99 %     Weight 06/29/16 2052 159 lb (72.1 kg)     Height 06/29/16 2052 5\' 9"  (1.753 m)     Pain Score 06/29/16 2052 4   Constitutional: Alert and oriented. Well appearing and in no acute distress. Eyes: Conjunctivae are normal. Head: Atraumatic. Nose: No congestion/rhinnorhea. Mouth/Throat: Mucous membranes are moist.  Oropharynx non-erythematous. Neck: No stridor.  Cardiovascular: Normal rate, regular rhythm. Good peripheral circulation. Grossly normal heart sounds.   Respiratory: Normal respiratory effort.  No retractions.  Lungs CTAB. Gastrointestinal: Soft and nontender. No distention.  Musculoskeletal: No lower extremity tenderness nor edema. No gross deformities of extremities. Neurologic:  Normal speech and language. No gross focal neurologic deficits are appreciated.  Skin:  Skin is warm, dry and intact. No rash noted.  ____________________________________________   LABS (all labs ordered are listed, but  only abnormal results are displayed)  Labs Reviewed  COMPREHENSIVE METABOLIC PANEL - Abnormal; Notable for the following:       Result Value   Glucose, Bld 268 (*)    BUN 21 (*)    All other components within normal limits  CBC WITH DIFFERENTIAL/PLATELET - Abnormal; Notable for the following:    WBC 11.7 (*)    HCT 38.9 (*)    Neutro Abs 10.6 (*)    Lymphs Abs 0.6 (*)    All other components within normal limits  URINALYSIS, ROUTINE W REFLEX MICROSCOPIC - Abnormal; Notable for the following:    Color, Urine RED (*)    APPearance TURBID (*)    Glucose, UA   (*)    Value: TEST NOT REPORTED DUE TO COLOR INTERFERENCE OF URINE PIGMENT   Hgb urine dipstick   (*)    Value: TEST NOT REPORTED DUE TO COLOR INTERFERENCE OF URINE PIGMENT   Bilirubin Urine   (*)    Value: TEST NOT REPORTED DUE TO COLOR INTERFERENCE OF URINE PIGMENT   Ketones, ur   (*)    Value: TEST NOT REPORTED DUE TO COLOR INTERFERENCE OF URINE PIGMENT   Protein, ur   (*)    Value: TEST NOT REPORTED DUE TO COLOR INTERFERENCE OF URINE PIGMENT   Nitrite   (*)    Value: TEST NOT REPORTED DUE TO COLOR INTERFERENCE OF URINE PIGMENT   Leukocytes, UA   (*)    Value: TEST NOT REPORTED DUE TO COLOR INTERFERENCE OF URINE PIGMENT   All other components within normal limits  URINALYSIS, MICROSCOPIC (REFLEX) - Abnormal; Notable for the following:    Squamous Epithelial / LPF 0-5 (*)    All other components within normal limits  LIPASE, BLOOD   ____________________________________________  RADIOLOGY  Ct Renal Stone Study  Result Date: 06/29/2016 CLINICAL DATA:  Acute onset of vomiting. Lower abdominal pain and hematuria. Initial encounter. EXAM: CT ABDOMEN AND PELVIS WITHOUT CONTRAST TECHNIQUE: Multidetector CT imaging of the abdomen and pelvis was performed following the standard protocol without IV contrast. COMPARISON:  CT of the abdomen and pelvis from 09/18/2011 FINDINGS: Lower chest: Mild scarring is noted at the lung bases.  Diffuse coronary artery calcifications are seen. Hepatobiliary: The liver is unremarkable in appearance. The gallbladder is unremarkable in appearance. The common bile duct remains normal in caliber. Pancreas: The pancreas is within normal limits. Spleen: The spleen is unremarkable in appearance. Adrenals/Urinary Tract: The adrenal glands are unremarkable in appearance. Nonobstructing bilateral renal stones are seen, much larger on the left, measuring up to 1.5 cm. Nonspecific perinephric stranding is noted bilaterally. There is no evidence of hydronephrosis. No renal or ureteral stones are identified. There is a 6 mm stone at the base of the bladder, which may reflect a recently passed right-sided stone, given asymmetric prominence of the right ureter. Stomach/Bowel: The stomach is unremarkable in appearance. The small bowel is within normal limits. The appendix is not visualized; there is no evidence for appendicitis. Scattered diverticulosis is noted involving most of the colon, with sparing of the sigmoid colon. There is no evidence of diverticulitis. Vascular/Lymphatic: Scattered calcification is seen  along the abdominal aorta and its branches. The abdominal aorta is otherwise grossly unremarkable. The inferior vena cava is grossly unremarkable. No retroperitoneal lymphadenopathy is seen. No pelvic sidewall lymphadenopathy is identified. Reproductive: Mild bladder wall thickening may reflect relative decompression, though would correlate clinically for any evidence of cystitis. The patient is status post prostatectomy. Other: No additional soft tissue abnormalities are seen. Musculoskeletal: No acute osseous abnormalities are identified. The visualized musculature is unremarkable in appearance. IMPRESSION: 1. 6 mm stone at the base of bladder may reflect a recently passed right-sided ureteral stone, given asymmetric prominence of the right ureter. No evidence of hydronephrosis at this time. 2. Mild bladder wall  thickening may reflect relative decompression, though would correlate clinically for any evidence of cystitis. If the patient's hematuria persists, cystoscopy could be considered to exclude underlying mass. 3. Nonobstructing bilateral renal stones, larger on the left, measuring up to 1.5 cm. 4. Scattered aortic atherosclerosis. 5. Scattered diverticulosis involving most of the colon, without evidence of diverticulitis. 6. Diffuse coronary artery calcifications seen. 7. Mild scarring at the lung bases. Electronically Signed   By: Garald Balding M.D.   On: 06/29/2016 23:28    ____________________________________________   PROCEDURES  Procedure(s) performed:   Procedures  None ____________________________________________   INITIAL IMPRESSION / ASSESSMENT AND PLAN / ED COURSE  Pertinent labs & imaging results that were available during my care of the patient were reviewed by me and considered in my medical decision making (see chart for details).  Patient resents to the emergency department for evaluation of gross hematuria with associated nausea and vomiting. No pain. Patient has no abdominal tenderness on my exam. No CVA tenderness. Low suspicion for a kidney stone or urinary tract infection. We will send labs and urinalysis. Have increased clinical suspicion for bladder cancer given history.   CT with 6 mm stone in the bladder. I discussed the results with the patient and family. They will call their Urologist in the AM. No evidence of infection on UA. No systemic sign/symptoms of infection. Discussed that if hematuria does not resolve after stone passage he may require cystoscopy for further evaluation.   At this time, I do not feel there is any life-threatening condition present. I have reviewed and discussed all results (EKG, imaging, lab, urine as appropriate), exam findings with patient. I have reviewed nursing notes and appropriate previous records.  I feel the patient is safe to be  discharged home without further emergent workup. Discussed usual and customary return precautions. Patient and family (if present) verbalize understanding and are comfortable with this plan.  Patient will follow-up with their primary care provider. If they do not have a primary care provider, information for follow-up has been provided to them. All questions have been answered.  ____________________________________________  FINAL CLINICAL IMPRESSION(S) / ED DIAGNOSES  Final diagnoses:  Hematuria, unspecified type  Kidney stone     MEDICATIONS GIVEN DURING THIS VISIT:  None  NEW OUTPATIENT MEDICATIONS STARTED DURING THIS VISIT:  Discharge Medication List as of 06/29/2016 11:59 PM    START taking these medications   Details  tamsulosin (FLOMAX) 0.4 MG CAPS capsule Take 1 capsule (0.4 mg total) by mouth daily., Starting Thu 06/29/2016, Print        Note:  This document was prepared using Dragon voice recognition software and may include unintentional dictation errors.  Nanda Quinton, MD Emergency Medicine   Margette Fast, MD 06/30/16 [phone]

## 2016-06-29 NOTE — ED Triage Notes (Addendum)
From [facility] where they live states vomited x 2 today with abdominal pain lower and hematuria no fever or dysuria voiced.  Hx of Kidney Stones.

## 2016-06-29 NOTE — Discharge Instructions (Signed)
You were seen in the ED today for blood in the urine. We found a 6 mm stone in the bladder which may be causing the blood in the urine.   You should call your Urologist tomorrow to discuss the blood in the urine. Return to the ED with any fever, chills, or difficulty urinating.

## 2016-06-30 DIAGNOSIS — E7800 Pure hypercholesterolemia, unspecified: Secondary | ICD-10-CM | POA: Diagnosis not present

## 2016-06-30 DIAGNOSIS — R112 Nausea with vomiting, unspecified: Secondary | ICD-10-CM | POA: Diagnosis not present

## 2016-07-01 DIAGNOSIS — R319 Hematuria, unspecified: Secondary | ICD-10-CM | POA: Diagnosis not present

## 2016-07-01 DIAGNOSIS — Z0189 Encounter for other specified special examinations: Secondary | ICD-10-CM | POA: Diagnosis not present

## 2016-07-24 ENCOUNTER — Telehealth: Payer: Self-pay | Admitting: *Deleted

## 2016-07-24 NOTE — Telephone Encounter (Signed)
-----   Message from Dennie Bible, NP sent at 07/24/2016 11:26 AM EDT ----- 30 day event monitoring, Sinus rhythm, Rare premature atrial contractions, Rare premature ventricular contractions No sustained arrhythmias No atrial fibrillation Compliance with wearing monitor was very poor

## 2016-07-24 NOTE — Telephone Encounter (Signed)
LMVM for pt to return call for lab results.  

## 2016-07-27 ENCOUNTER — Encounter: Payer: Self-pay | Admitting: *Deleted

## 2016-07-27 NOTE — Telephone Encounter (Signed)
No return call from pt or family member.  Mailed result not (letter) to pt and if questions he is to call us back.

## 2016-07-28 NOTE — Telephone Encounter (Signed)
Patients wife is returning your call

## 2016-07-28 NOTE — Telephone Encounter (Signed)
Spoke to wife and relayed results of cardiac event monitor.  Pt did not wear for total amount of time, due to skin reaction.  Wore for 14 days.  I relayed the results to her, and she will get letter as well in mail.  She appreciated call back.

## 2016-07-31 ENCOUNTER — Encounter: Payer: Self-pay | Admitting: *Deleted

## 2016-07-31 DIAGNOSIS — Z794 Long term (current) use of insulin: Secondary | ICD-10-CM | POA: Diagnosis not present

## 2016-07-31 DIAGNOSIS — E1142 Type 2 diabetes mellitus with diabetic polyneuropathy: Secondary | ICD-10-CM | POA: Diagnosis not present

## 2016-09-03 DIAGNOSIS — R600 Localized edema: Secondary | ICD-10-CM | POA: Diagnosis not present

## 2016-09-04 DIAGNOSIS — I890 Lymphedema, not elsewhere classified: Secondary | ICD-10-CM | POA: Diagnosis not present

## 2016-09-04 DIAGNOSIS — R600 Localized edema: Secondary | ICD-10-CM | POA: Diagnosis not present

## 2016-09-05 DIAGNOSIS — I10 Essential (primary) hypertension: Secondary | ICD-10-CM | POA: Diagnosis not present

## 2016-09-05 DIAGNOSIS — D649 Anemia, unspecified: Secondary | ICD-10-CM | POA: Diagnosis not present

## 2016-09-12 DIAGNOSIS — R2689 Other abnormalities of gait and mobility: Secondary | ICD-10-CM | POA: Diagnosis not present

## 2016-09-12 DIAGNOSIS — F0390 Unspecified dementia without behavioral disturbance: Secondary | ICD-10-CM | POA: Diagnosis not present

## 2016-09-12 DIAGNOSIS — R54 Age-related physical debility: Secondary | ICD-10-CM | POA: Diagnosis not present

## 2016-10-10 ENCOUNTER — Encounter (INDEPENDENT_AMBULATORY_CARE_PROVIDER_SITE_OTHER): Payer: Self-pay

## 2016-10-10 ENCOUNTER — Encounter: Payer: Self-pay | Admitting: Nurse Practitioner

## 2016-10-10 ENCOUNTER — Ambulatory Visit (INDEPENDENT_AMBULATORY_CARE_PROVIDER_SITE_OTHER): Payer: Medicare Other | Admitting: Nurse Practitioner

## 2016-10-10 VITALS — BP 125/70 | HR 70 | Ht 69.0 in | Wt 169.0 lb

## 2016-10-10 DIAGNOSIS — I10 Essential (primary) hypertension: Secondary | ICD-10-CM | POA: Diagnosis not present

## 2016-10-10 DIAGNOSIS — E785 Hyperlipidemia, unspecified: Secondary | ICD-10-CM

## 2016-10-10 DIAGNOSIS — G459 Transient cerebral ischemic attack, unspecified: Secondary | ICD-10-CM | POA: Diagnosis not present

## 2016-10-10 DIAGNOSIS — R269 Unspecified abnormalities of gait and mobility: Secondary | ICD-10-CM

## 2016-10-10 NOTE — Patient Instructions (Signed)
Per skilled sheet 

## 2016-10-10 NOTE — Progress Notes (Signed)
[facility]  PATIENT: [name] DOB: June 03, 1929   REASON FOR VISIT:  follow-up for left brain TIA suspect cardioembolic phenomena HISTORY FROM: Patient and wife    HISTORY OF PRESENT ILLNESS: UPDATE 06/05/2018[name], 83 year old male returns for follow-up for a hospital admission in December for right-sided weakness and aphasia MRI of the brain was negative for acute stroke. He currently resides at [facility] in the memory unit. He remains on Plavix for secondary stroke prevention without further stroke or TIA symptoms. He has minimal bruising and no bleeding. He remains on Lipitor without complaints of myalgias. 30 day event monitor Sinus rhythm, Rare premature atrial contractions, Rare premature ventricular contractionsNo sustained arrhythmias No atrial fibrillationCompliance with wearing monitor was very poor due to skin iritation.  Blood pressure in the office today 125/70. He continues to ambulate with a rolling walker in the facility. He has had no falls. He has had some ankle swelling and is wearing compression stockings. He returns for reevaluation  HISTORY 2/5/18[name], 83 year old male returns for follow-up after admission to the hospital in December for right-sided weakness and aphasia. He woke up that morning to say a few words but was groggy. In addition he had a right arm weakness. In the emergency room he was able to answer yes no questions. His aphasia resolved within one hour. MRI of the brain was negative for acute stroke CTA head and neck no significant stenosis of mild bilateral atherosclerosis. 2-D echo 60-65% EF. LDL 60 hemoglobin A1c 8.1. He was on aspirin 0.81 prior to admission and switched  to Plavix. He currently resides at [facility] in the memory unit. His wife has an apartment in assisted living at the same facility. He returns for stroke follow up today in the clinic. He remains on Plavix without bruising and bleeding. He has not  had further stroke or TIA symptoms. He has had some adjustments to his insulin and his most recent hemoglobin A1c according to the wife is 7. He remains on Lipitor without complaints of myalgias. Blood pressure well controlled today at 124/70 It was recommended that he get 30 day event monitor to rule out atrial fibrillation. This has not been set up. He returns for reevaluation   REVIEW OF SYSTEMS: Full 14 system review of systems performed and notable only for those listed, all others are neg:  Constitutional: neg  Cardiovascular:Ankle swelling  Ear/Nose/Throat: neg  Skin: neg Eyes: neg Respiratory: neg Gastroitestinal: neg  Hematology/Lymphatic:Anemia  Endocrine: neg Musculoskeletal: Walking difficulty Allergy/Immunology: neg Neurological: Memory loss, left brain TIA Psychiatric: neg Sleep : neg   ALLERGIES: Allergies  Allergen Reactions  . Actos [Pioglitazone]     Rash     HOME MEDICATIONS: Outpatient Medications Prior to Visit  Medication Sig Dispense Refill  . acetaminophen (TYLENOL) 325 MG tablet Take 650 mg by mouth every 6 (six) hours as needed for mild pain or fever.    Marland Kitchen alendronate (FOSAMAX) 70 MG tablet Take 70 mg by mouth once a week. Take with a full glass of water on an empty stomach.    Marland Kitchen atorvastatin (LIPITOR) 10 MG tablet Take 10 mg by mouth daily.    . cholecalciferol (VITAMIN D) 1000 units tablet Take 1,000 Units by mouth daily.    . clopidogrel (PLAVIX) 75 MG tablet Take 1 tablet (75 mg total) by mouth daily. 30 tablet 2  . donepezil (ARICEPT) 10 MG tablet Take 10 mg by mouth at bedtime.    . insulin  aspart (NOVOLOG) 100 UNIT/ML injection Inject 0-11 Units into the skin 3 (three) times daily before meals. Sliding scale: <70 or >400: notify MD, 0- 69: 0 units, 70-199: 10 units, 200-299: 11 units, 300-399:12 units    . insulin glargine (LANTUS) 100 UNIT/ML injection Inject 32 Units into the skin daily.     Marland Kitchen losartan (COZAAR) 25 MG tablet Take 25 mg by mouth  daily.    . Melatonin 5 MG TABS Take 5 mg by mouth at bedtime as needed (sleep).    . ondansetron (ZOFRAN) 4 MG tablet Take 1 tablet (4 mg total) by mouth every 6 (six) hours. 12 tablet 0  . tamsulosin (FLOMAX) 0.4 MG CAPS capsule Take 1 capsule (0.4 mg total) by mouth daily. 30 capsule 0   No facility-administered medications prior to visit.     PAST MEDICAL HISTORY: Past Medical History:  Diagnosis Date  . Bladder cancer ([place])   . Dementia   . Diabetes mellitus without complication ([place])   . Hyperlipidemia   . Hypertension   . Osteoporosis   . Prostate CA ([place])    HISTORY OF    PAST SURGICAL HISTORY: Past Surgical History:  Procedure Laterality Date  . PROSTATE SURGERY    . rotator cuff surgery    . TONSILLECTOMY      FAMILY HISTORY: No family history on file.  SOCIAL HISTORY: Social History   Social History  . Marital status: Married    Spouse name: N/A  . Number of children: N/A  . Years of education: N/A   Occupational History  . Not on file.   Social History Main Topics  . Smoking status: Never Smoker  . Smokeless tobacco: Never Used  . Alcohol use No  . Drug use: No  . Sexual activity: Not on file   Other Topics Concern  . Not on file   Social History Narrative   Resides at [facility].      PHYSICAL EXAM  Vitals:   10/10/16 1108  BP: 125/70  Pulse: 70  Weight: 169 lb (76.7 kg)  Height: 5\' 9"  (1.753 m)   Body mass index is 24.96 kg/m.  Generalized: Well developed, in no acute distress  Head: normocephalic and atraumatic,. Oropharynx benign  Neck: Supple, no carotid bruits  Cardiac: Regular rate rhythm, no murmur  Musculoskeletal: No deformity   Neurological examination   Mentation: Alert oriented to time, place, not the year, wife provides  History.  Follows all commands speech and language fluent.   Cranial nerve II-XII: Pupils were equal round reactive to light extraocular movements were full, visual field were full on  confrontational test. Facial sensation and strength were normal. hearing was intact to finger rubbing bilaterally. Uvula tongue midline. head turning and shoulder shrug were normal and symmetric.Tongue protrusion into cheek strength was normal. Motor: normal bulk and tone, full strength in the BUE, BLE, fine finger movements normal, no pronator drift. No focal weakness Sensory: normal and symmetric to light touch, pinprick, and  Vibration, in the upper and lower extremities  Coordination: finger-nose-finger, heel-to-shin bilaterally, no dysmetria, no tremor  Reflexes: 1+ upper lower and symmetric plantar responses were flexor bilaterally. Gait and Station: Rising up from seated position with push off. Slightly stooped posture, ambulates with a rolling walker, no difficulty with turns   DIAGNOSTIC DATA (LABS, IMAGING, TESTING) - I reviewed patient records, labs, notes, testing and imaging myself where available.  Lab Results  Component Value Date   WBC 11.7 (H) 06/29/2016  HGB 13.4 06/29/2016   HCT 38.9 (L) 06/29/2016   MCV 90.0 06/29/2016   PLT 228 06/29/2016      Component Value Date/Time   NA 136 06/29/2016 2137   K 4.3 06/29/2016 2137   CL 103 06/29/2016 2137   CO2 23 06/29/2016 2137   GLUCOSE 268 (H) 06/29/2016 2137   BUN 21 (H) 06/29/2016 2137   CREATININE 1.02 06/29/2016 2137   CALCIUM 9.3 06/29/2016 2137   PROT 7.5 06/29/2016 2137   ALBUMIN 4.3 06/29/2016 2137   AST 27 06/29/2016 2137   ALT 32 06/29/2016 2137   ALKPHOS 51 06/29/2016 2137   BILITOT 0.5 06/29/2016 2137   GFRNONAA >60 06/29/2016 2137   GFRAA >60 06/29/2016 2137   Lab Results  Component Value Date   CHOL 128 04/14/2016   HDL 56 04/14/2016   LDLCALC 60 04/14/2016   TRIG 60 04/14/2016   CHOLHDL 2.3 04/14/2016   Lab Results  Component Value Date   HGBA1C 8.1 (H) 04/14/2016    ASSESSMENT AND PLAN  83 y.o. year old male  has a past medical history of Dementia; Diabetes mellitus without complication  ([place]); Hyperlipidemia; Hypertension; And  left brain TIA in Dec 2017 here to follow-up in stroke clinic. The patient is a current patient of Dr. Erlinda Hong  who is out of the office today . This note is sent to the work in doctor.     PLAN: Stressed the importance of management of risk factors to prevent further stroke Continue Plavix for secondary stroke prevention 30 day event monitor Sinus rhythm, Rare premature atrial contractions, Rare premature ventricular contractionsNo sustained arrhythmias No atrial fibrillationCompliance with wearing monitor was very poor due to skin iritation. Maintain strict control of hypertension with blood pressure goal below 130/90, today's reading 124/70  continue antihypertensive medications Control of diabetes with hemoglobin A1c below 6.5 followed by primary care most recent hemoglobin A1c7 continue diabetic medications, insulin Cholesterol with LDL cholesterol less than 70, followed by primary care,  Continue  Lipitor Use walker at all times for safe ambulation at risk for falls Exercise by walking,  eat healthy diet with whole grains,  fresh fruits and vegetables Follow up in 6 months if stable will dismiss I spent 25 minutes in total face to face time with the patient and the wife, more than 50% of which was spent counseling and coordination of care, reviewing test results reviewing medications and discussing and reviewing the diagnosis of TIA stroke risk and management of risk factors, answering  additional questions A portion of the above documentation was copied from her prior visit note for review purposes only Dennie Bible, [facility], [facility], APRN  [facility] [address], [place], [place] [address] [phone]

## 2016-10-10 NOTE — Progress Notes (Signed)
I agree with the assessment and plan as directed by NP .The patient is not known to me , I sign as WID.   Andilyn Bettcher, MD

## 2016-10-17 DIAGNOSIS — R05 Cough: Secondary | ICD-10-CM | POA: Diagnosis not present

## 2016-10-25 DIAGNOSIS — R05 Cough: Secondary | ICD-10-CM | POA: Diagnosis not present

## 2016-10-25 DIAGNOSIS — J208 Acute bronchitis due to other specified organisms: Secondary | ICD-10-CM | POA: Diagnosis not present

## 2016-10-26 DIAGNOSIS — M6281 Muscle weakness (generalized): Secondary | ICD-10-CM | POA: Diagnosis not present

## 2016-10-26 DIAGNOSIS — E639 Nutritional deficiency, unspecified: Secondary | ICD-10-CM | POA: Diagnosis not present

## 2016-11-01 DIAGNOSIS — D225 Melanocytic nevi of trunk: Secondary | ICD-10-CM | POA: Diagnosis not present

## 2016-11-01 DIAGNOSIS — L570 Actinic keratosis: Secondary | ICD-10-CM | POA: Diagnosis not present

## 2016-11-01 DIAGNOSIS — Z08 Encounter for follow-up examination after completed treatment for malignant neoplasm: Secondary | ICD-10-CM | POA: Diagnosis not present

## 2016-11-01 DIAGNOSIS — L821 Other seborrheic keratosis: Secondary | ICD-10-CM | POA: Diagnosis not present

## 2016-11-01 DIAGNOSIS — Z85820 Personal history of malignant melanoma of skin: Secondary | ICD-10-CM | POA: Diagnosis not present

## 2016-11-01 DIAGNOSIS — X32XXXD Exposure to sunlight, subsequent encounter: Secondary | ICD-10-CM | POA: Diagnosis not present

## 2016-11-09 DIAGNOSIS — E119 Type 2 diabetes mellitus without complications: Secondary | ICD-10-CM | POA: Diagnosis not present

## 2016-11-09 DIAGNOSIS — Z794 Long term (current) use of insulin: Secondary | ICD-10-CM | POA: Diagnosis not present

## 2016-11-09 DIAGNOSIS — Z961 Presence of intraocular lens: Secondary | ICD-10-CM | POA: Diagnosis not present

## 2016-11-15 DIAGNOSIS — E639 Nutritional deficiency, unspecified: Secondary | ICD-10-CM | POA: Diagnosis not present

## 2016-11-15 DIAGNOSIS — I10 Essential (primary) hypertension: Secondary | ICD-10-CM | POA: Diagnosis not present

## 2016-11-15 DIAGNOSIS — E1142 Type 2 diabetes mellitus with diabetic polyneuropathy: Secondary | ICD-10-CM | POA: Diagnosis not present

## 2016-12-13 DIAGNOSIS — R600 Localized edema: Secondary | ICD-10-CM | POA: Diagnosis not present

## 2017-02-01 DIAGNOSIS — Z794 Long term (current) use of insulin: Secondary | ICD-10-CM | POA: Diagnosis not present

## 2017-02-01 DIAGNOSIS — E1142 Type 2 diabetes mellitus with diabetic polyneuropathy: Secondary | ICD-10-CM | POA: Diagnosis not present

## 2017-02-04 ENCOUNTER — Emergency Department (HOSPITAL_BASED_OUTPATIENT_CLINIC_OR_DEPARTMENT_OTHER)
Admission: EM | Admit: 2017-02-04 | Discharge: 2017-02-04 | Disposition: A | Discharge status: Home / Self Care | Payer: Medicare Other | Attending: Emergency Medicine | Admitting: Emergency Medicine

## 2017-02-04 ENCOUNTER — Encounter (HOSPITAL_BASED_OUTPATIENT_CLINIC_OR_DEPARTMENT_OTHER): Payer: Self-pay | Admitting: Adult Health

## 2017-02-04 DIAGNOSIS — I10 Essential (primary) hypertension: Secondary | ICD-10-CM | POA: Diagnosis not present

## 2017-02-04 DIAGNOSIS — Y998 Other external cause status: Secondary | ICD-10-CM | POA: Insufficient documentation

## 2017-02-04 DIAGNOSIS — S59911A Unspecified injury of right forearm, initial encounter: Secondary | ICD-10-CM | POA: Diagnosis present

## 2017-02-04 DIAGNOSIS — Y9389 Activity, other specified: Secondary | ICD-10-CM | POA: Insufficient documentation

## 2017-02-04 DIAGNOSIS — X58XXXA Exposure to other specified factors, initial encounter: Secondary | ICD-10-CM | POA: Insufficient documentation

## 2017-02-04 DIAGNOSIS — Z8546 Personal history of malignant neoplasm of prostate: Secondary | ICD-10-CM | POA: Insufficient documentation

## 2017-02-04 DIAGNOSIS — Z8551 Personal history of malignant neoplasm of bladder: Secondary | ICD-10-CM | POA: Diagnosis not present

## 2017-02-04 DIAGNOSIS — Z794 Long term (current) use of insulin: Secondary | ICD-10-CM | POA: Diagnosis not present

## 2017-02-04 DIAGNOSIS — T148XXA Other injury of unspecified body region, initial encounter: Secondary | ICD-10-CM

## 2017-02-04 DIAGNOSIS — S50311A Abrasion of right elbow, initial encounter: Secondary | ICD-10-CM | POA: Diagnosis not present

## 2017-02-04 DIAGNOSIS — Z8673 Personal history of transient ischemic attack (TIA), and cerebral infarction without residual deficits: Secondary | ICD-10-CM | POA: Diagnosis not present

## 2017-02-04 DIAGNOSIS — E119 Type 2 diabetes mellitus without complications: Secondary | ICD-10-CM | POA: Insufficient documentation

## 2017-02-04 DIAGNOSIS — Z7902 Long term (current) use of antithrombotics/antiplatelets: Secondary | ICD-10-CM | POA: Insufficient documentation

## 2017-02-04 DIAGNOSIS — S60811A Abrasion of right wrist, initial encounter: Secondary | ICD-10-CM | POA: Diagnosis not present

## 2017-02-04 DIAGNOSIS — W19XXXA Unspecified fall, initial encounter: Secondary | ICD-10-CM

## 2017-02-04 DIAGNOSIS — Y929 Unspecified place or not applicable: Secondary | ICD-10-CM | POA: Insufficient documentation

## 2017-02-04 DIAGNOSIS — S51011A Laceration without foreign body of right elbow, initial encounter: Secondary | ICD-10-CM | POA: Diagnosis not present

## 2017-02-04 NOTE — ED Triage Notes (Signed)
Presents post fall while walking outside with wife. He denies pain, denies head injury. Skin tears noted top right elbow and wrist. He denies LOC. Alert and oriented. He is on blood thinners.

## 2017-02-04 NOTE — ED Provider Notes (Signed)
[facility] DEPT [facility] Provider Note   CSN: [id number] Arrival date & time: 02/04/17  1326     History   Chief Complaint Chief Complaint  Patient presents with  . Fall    HPI [name] is a 83 y.o. male.  HPI Patient, with past medical history of hypertension, diabetes, dementia, presents to ED for evaluation of fall. His wife was pushing him on his wheelchair when all of a sudden he swerved off the ramp and landed on his right arm. He reports superficial abrasions noted to elbow and wrist but denies any head injury or loss of consciousness. He denies any pain with range of motion of right arm. States that he has on blood thinners. He denies any other complaints including no chest pain, shortness of breath, headache, vision changes, nausea, vomiting.  Past Medical History:  Diagnosis Date  . Bladder cancer ([place])   . Dementia   . Diabetes mellitus without complication ([place])   . Hyperlipidemia   . Hypertension   . Osteoporosis   . Prostate CA [facility])    HISTORY OF    Patient Active Problem List   Diagnosis Date Noted  . HTN (hypertension) 06/12/2016  . Hyperlipemia 06/12/2016  . Abnormality of gait 06/12/2016  . Aphasia   . TIA (transient ischemic attack) 04/14/2016    Past Surgical History:  Procedure Laterality Date  . PROSTATE SURGERY    . rotator cuff surgery    . TONSILLECTOMY         Home Medications    Prior to Admission medications   Medication Sig Start Date End Date Taking? Authorizing Provider  acetaminophen (TYLENOL) 325 MG tablet Take 650 mg by mouth every 6 (six) hours as needed for mild pain or fever.    [provider]  alendronate (FOSAMAX) 70 MG tablet Take 70 mg by mouth once a week. Take with a full glass of water on an empty stomach.    [provider]  atorvastatin (LIPITOR) 10 MG tablet Take 10 mg by mouth daily.    [provider]  cholecalciferol (VITAMIN D) 1000 units tablet Take 2,000 Units by mouth  daily.     [provider]  clopidogrel (PLAVIX) 75 MG tablet Take 1 tablet (75 mg total) by mouth daily. 04/15/16   Velna Ochs, MD  donepezil (ARICEPT) 10 MG tablet Take 10 mg by mouth at bedtime. 12/06/15   [provider]  insulin aspart (NOVOLOG) 100 UNIT/ML injection Inject 0-11 Units into the skin 3 (three) times daily before meals. Sliding scale: <70 or >400: notify MD, 0- 69: 0 units, 70-199: 10 units, 200-299: 11 units, 300-399:12 units    [provider]  insulin glargine (LANTUS) 100 UNIT/ML injection Inject 32 Units into the skin daily.     [provider]  losartan (COZAAR) 25 MG tablet Take 25 mg by mouth daily.    [provider]  Melatonin 5 MG TABS Take 5 mg by mouth at bedtime as needed (sleep).    [provider]  nystatin cream (MYCOSTATIN) Apply 1 application topically 2 (two) times daily as needed for dry skin.    [provider]  ondansetron (ZOFRAN) 4 MG tablet Take 1 tablet (4 mg total) by mouth every 6 (six) hours. 06/29/16   Long, Wonda Olds, MD  tamsulosin (FLOMAX) 0.4 MG CAPS capsule Take 1 capsule (0.4 mg total) by mouth daily. 06/29/16   Long, Wonda Olds, MD  triamcinolone cream (KENALOG) 0.1 % Apply  1 application topically 3 (three) times daily as needed.    [provider]    Family History History reviewed. No pertinent family history.  Social History Social History  Substance Use Topics  . Smoking status: Never Smoker  . Smokeless tobacco: Never Used  . Alcohol use No     Allergies   Actos [pioglitazone]   Review of Systems Review of Systems  Constitutional: Negative for appetite change, chills and fever.  HENT: Negative for ear pain, rhinorrhea, sneezing and sore throat.   Eyes: Negative for photophobia and visual disturbance.  Respiratory: Negative for cough, chest tightness, shortness of breath and wheezing.   Cardiovascular: Negative for chest pain and palpitations.    Gastrointestinal: Negative for abdominal pain, blood in stool, constipation, diarrhea, nausea and vomiting.  Genitourinary: Negative for dysuria, hematuria and urgency.  Musculoskeletal: Positive for arthralgias and myalgias.  Skin: Positive for wound. Negative for rash.  Neurological: Negative for dizziness, weakness and light-headedness.     Physical Exam Updated Vital Signs BP (!) 145/72 (BP Location: Left Arm)   Pulse 73   Temp 98.3 F (36.8 C) (Oral)   Resp 18   SpO2 96%   Physical Exam  Constitutional: He appears well-developed and well-nourished. No distress.  HENT:  Head: Normocephalic and atraumatic.  Nose: Nose normal.  Eyes: Conjunctivae and EOM are normal. Left eye exhibits no discharge. No scleral icterus.  Neck: Normal range of motion. Neck supple.  Cardiovascular: Normal rate, regular rhythm, normal heart sounds and intact distal pulses.  Exam reveals no gallop and no friction rub.   No murmur heard. Pulmonary/Chest: Effort normal and breath sounds normal. No respiratory distress.  Abdominal: Soft. Bowel sounds are normal. He exhibits no distension. There is no tenderness. There is no guarding.  Musculoskeletal: Normal range of motion. He exhibits no edema.  Neurological: He is alert. He exhibits normal muscle tone. Coordination normal.  Skin: Skin is warm and dry. Abrasion noted. No rash noted.  Multiple superficial skin abrasions noted to R wrist and around elbow. Full active and passive range of motion of wrist and elbow.  Psychiatric: He has a normal mood and affect.  Nursing note and vitals reviewed.    ED Treatments / Results  Labs (all labs ordered are listed, but only abnormal results are displayed) Labs Reviewed - No data to display  EKG  EKG Interpretation None       Radiology No results found.  Procedures Procedures (including critical care time)  Medications Ordered in ED Medications - No data to display   Initial Impression /  Assessment and Plan / ED Course  I have reviewed the triage vital signs and the nursing notes.  Pertinent labs & imaging results that were available during my care of the patient were reviewed by me and considered in my medical decision making (see chart for details).     Patient presents to ED for evaluation of fall that occurred prior to arrival. He was being pushed on his wheelchair by his wife when he accidentally swerved over the ramp and landed on his right side. He reports still blood thinner use but denies any head injury or loss of consciousness. He has full active and passive range of motion of wrist and elbow on physical examination. There are several superficial abrasions noted on the right wrist and around the elbow with some active bleeding. No focal deficits on neurological exam. He has no chest pain or shortness of breath. I doubt acute intracranial  abnormality as a result of this fall based on no head injury, loss of consciousness or other neurological exam findings that were abnormal. Area was cleaned and antibiotic ointment applied. Advised patient to follow up with PCP for further evaluation. Patient appears stable for discharge at this time. Strict return precautions given.  Final Clinical Impressions(s) / ED Diagnoses   Final diagnoses:  None    New Prescriptions New Prescriptions   No medications on file     Delia Heady, PA-C 02/05/17 [place], [place], DO 02/05/17 1510

## 2017-02-04 NOTE — Discharge Instructions (Signed)
Please read attached information regarding your condition. Apply antibiotic ointment to affected areas. Follow-up with your PCP for further evaluation. Return to ED for worsening pain, additional injury, vision changes, head injuries, numbness in legs.

## 2017-02-23 DIAGNOSIS — E7800 Pure hypercholesterolemia, unspecified: Secondary | ICD-10-CM | POA: Diagnosis not present

## 2017-02-23 DIAGNOSIS — E1142 Type 2 diabetes mellitus with diabetic polyneuropathy: Secondary | ICD-10-CM | POA: Diagnosis not present

## 2017-02-23 DIAGNOSIS — F0390 Unspecified dementia without behavioral disturbance: Secondary | ICD-10-CM | POA: Diagnosis not present

## 2017-02-23 DIAGNOSIS — N390 Urinary tract infection, site not specified: Secondary | ICD-10-CM | POA: Diagnosis not present

## 2017-02-28 DIAGNOSIS — R278 Other lack of coordination: Secondary | ICD-10-CM | POA: Diagnosis not present

## 2017-02-28 DIAGNOSIS — R2681 Unsteadiness on feet: Secondary | ICD-10-CM | POA: Diagnosis not present

## 2017-02-28 DIAGNOSIS — F0390 Unspecified dementia without behavioral disturbance: Secondary | ICD-10-CM | POA: Diagnosis not present

## 2017-02-28 DIAGNOSIS — Z9181 History of falling: Secondary | ICD-10-CM | POA: Diagnosis not present

## 2017-03-01 DIAGNOSIS — Z9181 History of falling: Secondary | ICD-10-CM | POA: Diagnosis not present

## 2017-03-01 DIAGNOSIS — R278 Other lack of coordination: Secondary | ICD-10-CM | POA: Diagnosis not present

## 2017-03-01 DIAGNOSIS — R2681 Unsteadiness on feet: Secondary | ICD-10-CM | POA: Diagnosis not present

## 2017-03-01 DIAGNOSIS — F0390 Unspecified dementia without behavioral disturbance: Secondary | ICD-10-CM | POA: Diagnosis not present

## 2017-03-01 DIAGNOSIS — E1142 Type 2 diabetes mellitus with diabetic polyneuropathy: Secondary | ICD-10-CM | POA: Diagnosis not present

## 2017-03-05 DIAGNOSIS — R2681 Unsteadiness on feet: Secondary | ICD-10-CM | POA: Diagnosis not present

## 2017-03-05 DIAGNOSIS — F0390 Unspecified dementia without behavioral disturbance: Secondary | ICD-10-CM | POA: Diagnosis not present

## 2017-03-05 DIAGNOSIS — R278 Other lack of coordination: Secondary | ICD-10-CM | POA: Diagnosis not present

## 2017-03-05 DIAGNOSIS — Z9181 History of falling: Secondary | ICD-10-CM | POA: Diagnosis not present

## 2017-03-06 DIAGNOSIS — B351 Tinea unguium: Secondary | ICD-10-CM | POA: Diagnosis not present

## 2017-03-06 DIAGNOSIS — M79675 Pain in left toe(s): Secondary | ICD-10-CM | POA: Diagnosis not present

## 2017-03-08 DIAGNOSIS — R278 Other lack of coordination: Secondary | ICD-10-CM | POA: Diagnosis not present

## 2017-03-08 DIAGNOSIS — Z9181 History of falling: Secondary | ICD-10-CM | POA: Diagnosis not present

## 2017-03-08 DIAGNOSIS — F0390 Unspecified dementia without behavioral disturbance: Secondary | ICD-10-CM | POA: Diagnosis not present

## 2017-03-08 DIAGNOSIS — R2681 Unsteadiness on feet: Secondary | ICD-10-CM | POA: Diagnosis not present

## 2017-03-09 DIAGNOSIS — R278 Other lack of coordination: Secondary | ICD-10-CM | POA: Diagnosis not present

## 2017-03-09 DIAGNOSIS — R2681 Unsteadiness on feet: Secondary | ICD-10-CM | POA: Diagnosis not present

## 2017-03-09 DIAGNOSIS — Z9181 History of falling: Secondary | ICD-10-CM | POA: Diagnosis not present

## 2017-03-09 DIAGNOSIS — F0390 Unspecified dementia without behavioral disturbance: Secondary | ICD-10-CM | POA: Diagnosis not present

## 2017-03-13 DIAGNOSIS — Z9181 History of falling: Secondary | ICD-10-CM | POA: Diagnosis not present

## 2017-03-13 DIAGNOSIS — R278 Other lack of coordination: Secondary | ICD-10-CM | POA: Diagnosis not present

## 2017-03-13 DIAGNOSIS — R2681 Unsteadiness on feet: Secondary | ICD-10-CM | POA: Diagnosis not present

## 2017-03-13 DIAGNOSIS — F0390 Unspecified dementia without behavioral disturbance: Secondary | ICD-10-CM | POA: Diagnosis not present

## 2017-03-14 DIAGNOSIS — F0390 Unspecified dementia without behavioral disturbance: Secondary | ICD-10-CM | POA: Diagnosis not present

## 2017-03-14 DIAGNOSIS — R278 Other lack of coordination: Secondary | ICD-10-CM | POA: Diagnosis not present

## 2017-03-14 DIAGNOSIS — Z9181 History of falling: Secondary | ICD-10-CM | POA: Diagnosis not present

## 2017-03-14 DIAGNOSIS — R2681 Unsteadiness on feet: Secondary | ICD-10-CM | POA: Diagnosis not present

## 2017-03-15 DIAGNOSIS — R2681 Unsteadiness on feet: Secondary | ICD-10-CM | POA: Diagnosis not present

## 2017-03-15 DIAGNOSIS — Z9181 History of falling: Secondary | ICD-10-CM | POA: Diagnosis not present

## 2017-03-15 DIAGNOSIS — R278 Other lack of coordination: Secondary | ICD-10-CM | POA: Diagnosis not present

## 2017-03-15 DIAGNOSIS — F0390 Unspecified dementia without behavioral disturbance: Secondary | ICD-10-CM | POA: Diagnosis not present

## 2017-03-19 DIAGNOSIS — F0390 Unspecified dementia without behavioral disturbance: Secondary | ICD-10-CM | POA: Diagnosis not present

## 2017-03-19 DIAGNOSIS — Z9181 History of falling: Secondary | ICD-10-CM | POA: Diagnosis not present

## 2017-03-19 DIAGNOSIS — R278 Other lack of coordination: Secondary | ICD-10-CM | POA: Diagnosis not present

## 2017-03-19 DIAGNOSIS — R2681 Unsteadiness on feet: Secondary | ICD-10-CM | POA: Diagnosis not present

## 2017-04-10 IMAGING — CT CT RENAL STONE PROTOCOL
2 of 3 series · 15 of 46 positions shown, 17 images · non-contrast
Comparison: CT of the abdomen and pelvis from 09/18/2011

CLINICAL DATA: Acute onset of vomiting. Lower abdominal pain and
hematuria. Initial encounter.

EXAM:
CT ABDOMEN AND PELVIS WITHOUT CONTRAST
TECHNIQUE: Multidetector CT imaging of the abdomen and pelvis was performed
following the standard protocol without IV contrast.

[Series 3: coronal · coronal · 0.70mm/px · 3 of 137 slices shown]
[im 46/137  soft-tissue]
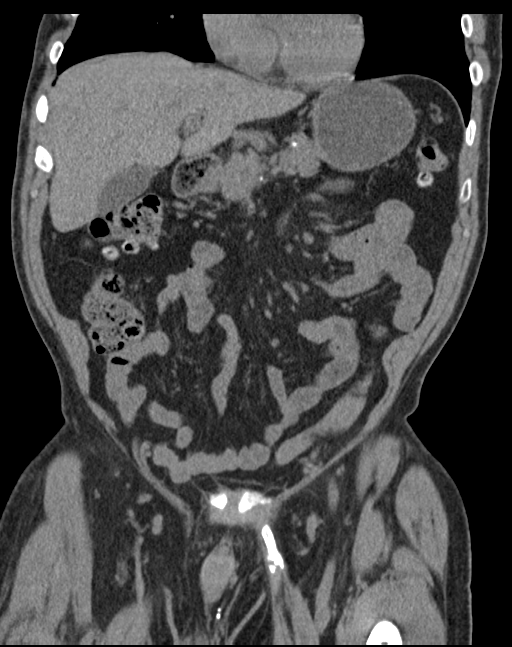
[im 61/137  soft-tissue]
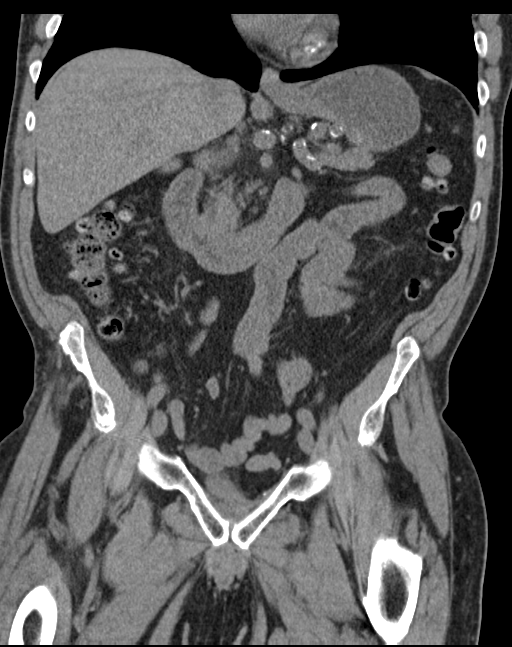
[im 76/137  soft-tissue]
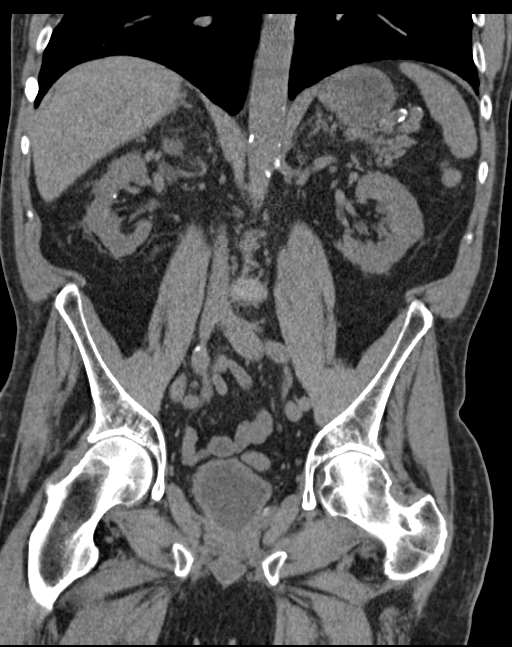

[Series 6: lung · axial · 0.87mm/px · z∈[+1504,+1582]mm · 12 of 45 slices shown, 14 images]
[im 3/45  soft-tissue]
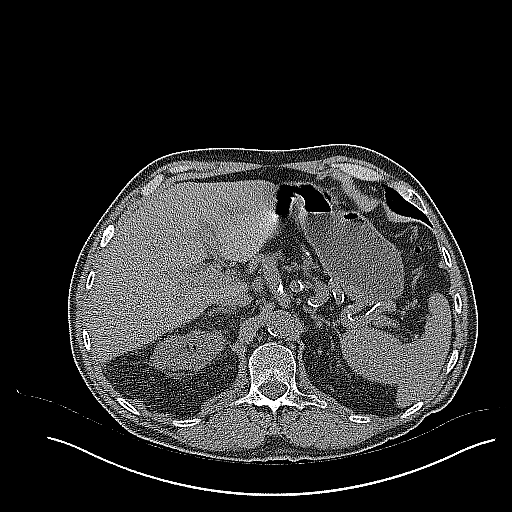
[im 3/45  bone]
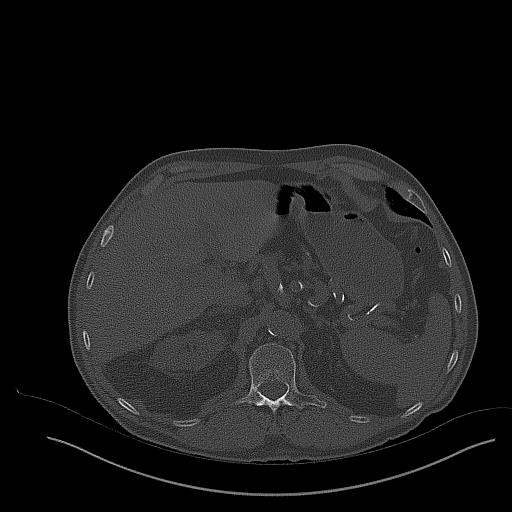
[im 6/45  soft-tissue]
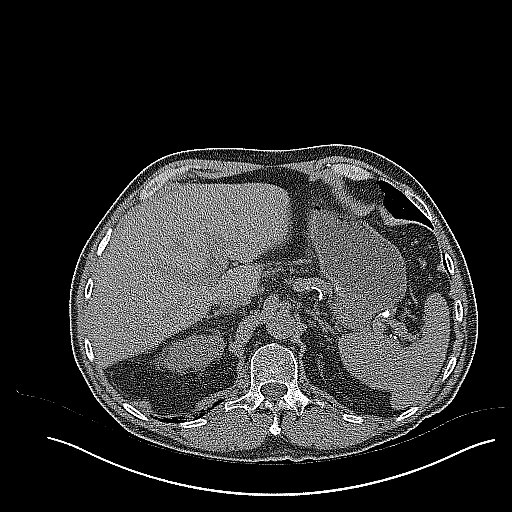
[im 10/45  soft-tissue]
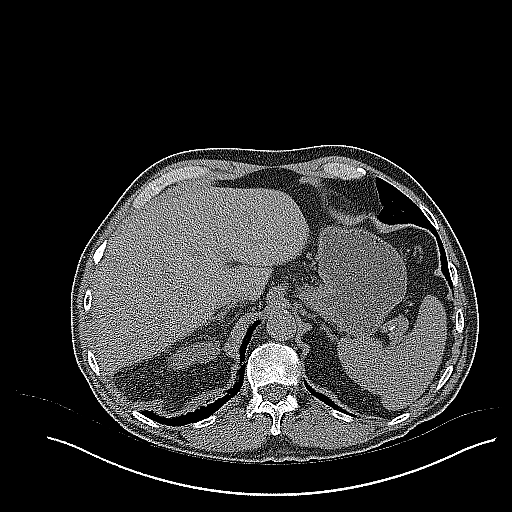
[im 13/45  soft-tissue]
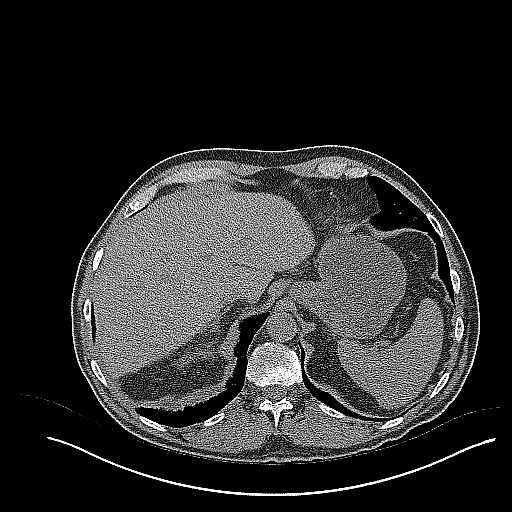
[im 18/45  soft-tissue]
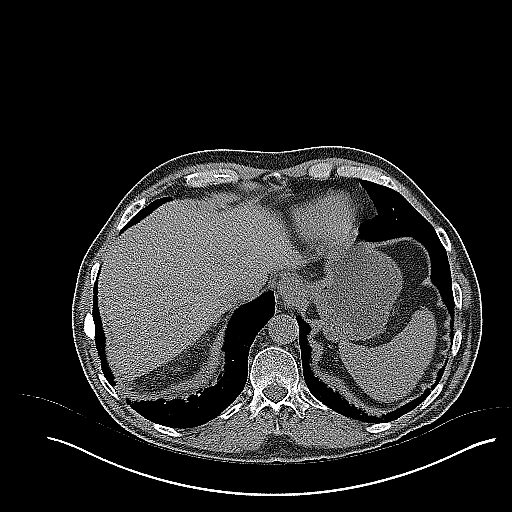
[im 20/45  soft-tissue]
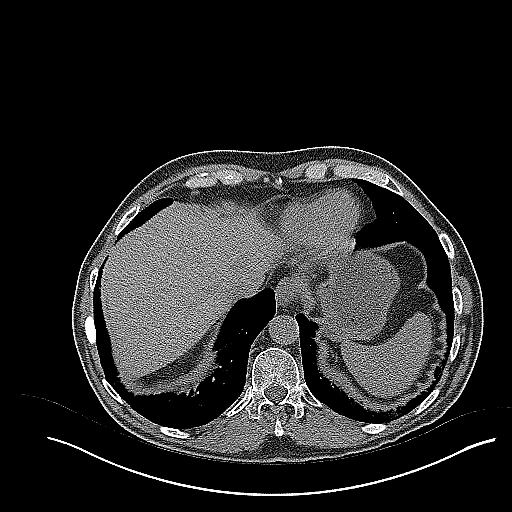
[im 25/45  soft-tissue]
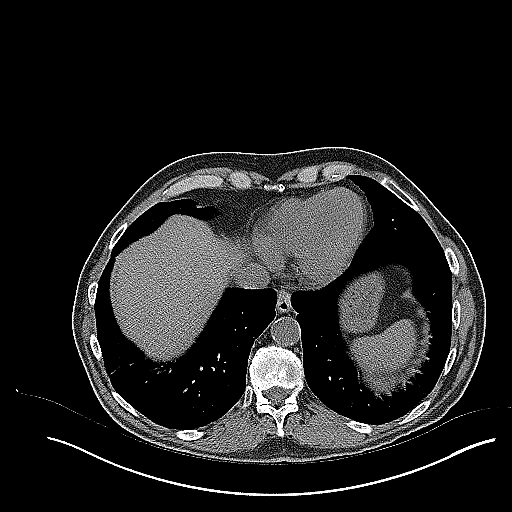
[im 27/45  soft-tissue]
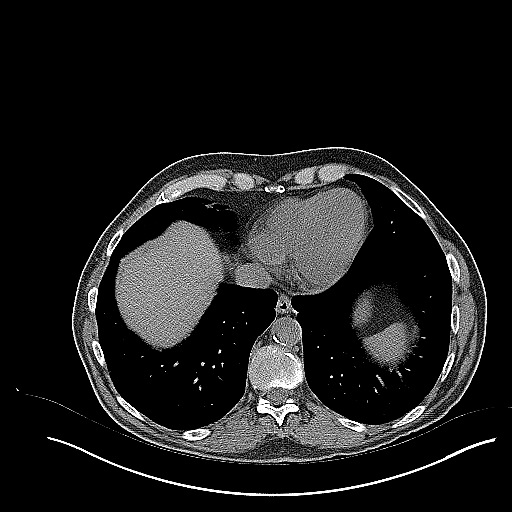
[im 32/45  soft-tissue]
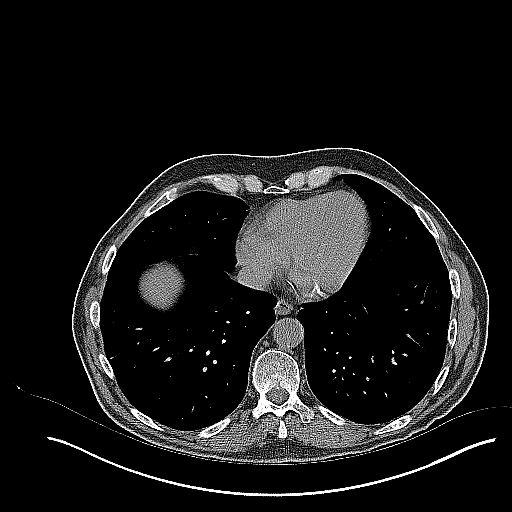
[im 32/45  bone]
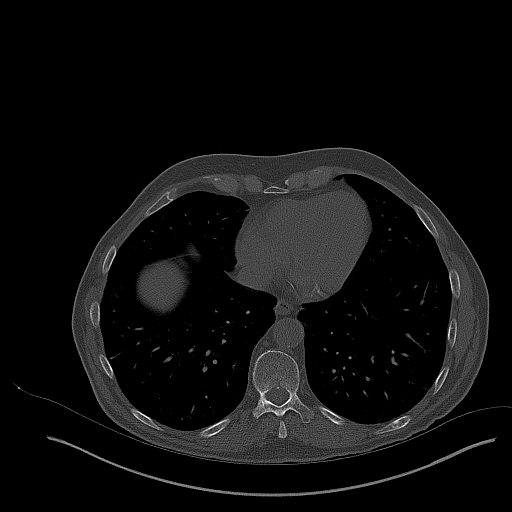
[im 35/45  soft-tissue]
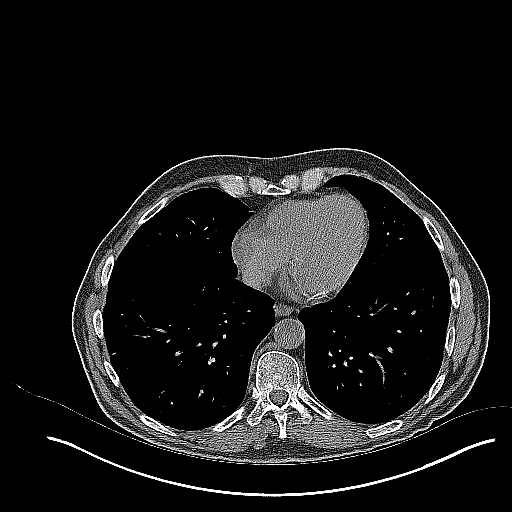
[im 39/45  soft-tissue]
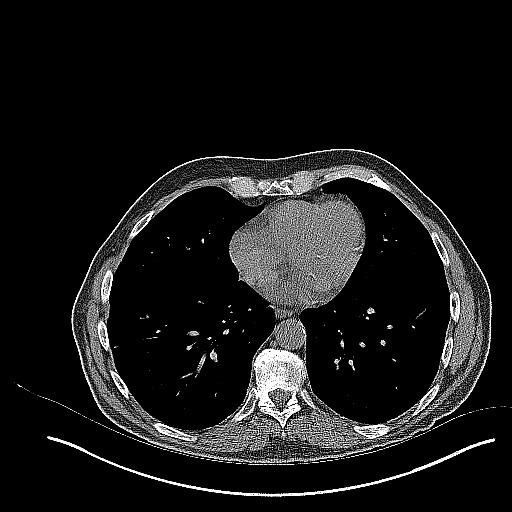
[im 42/45  soft-tissue]
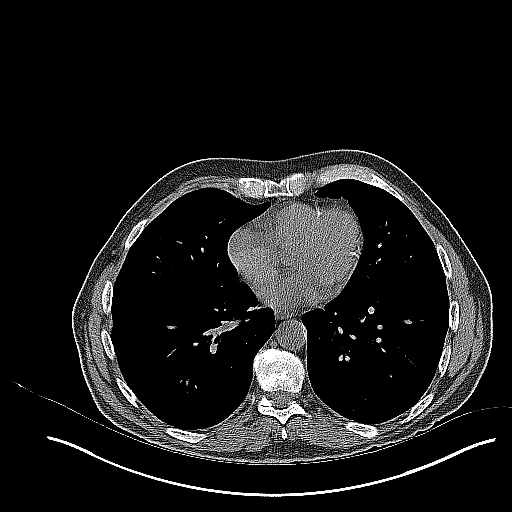

[15 of 46 positions shown; findings below may reference images not displayed]

FINDINGS: Lower chest: Mild scarring is noted at the lung bases. Diffuse
coronary artery calcifications are seen.

Hepatobiliary: The liver is unremarkable in appearance. The
gallbladder is unremarkable in appearance. The common bile duct
remains normal in caliber.

Pancreas: The pancreas is within normal limits.

Spleen: The spleen is unremarkable in appearance.

Adrenals/Urinary Tract: The adrenal glands are unremarkable in
appearance.

Nonobstructing bilateral renal stones are seen, much larger on the
left, measuring up to 1.5 cm. Nonspecific perinephric stranding is
noted bilaterally. There is no evidence of hydronephrosis. No renal
or ureteral stones are identified.

There is a 6 mm stone at the base of the bladder, which may reflect
a recently passed right-sided stone, given asymmetric prominence of
the right ureter.

Stomach/Bowel: The stomach is unremarkable in appearance. The small
bowel is within normal limits. The appendix is not visualized; there
is no evidence for appendicitis.

Scattered diverticulosis is noted involving most of the colon, with
sparing of the sigmoid colon. There is no evidence of
diverticulitis.

Vascular/Lymphatic: Scattered calcification is seen along the
abdominal aorta and its branches. The abdominal aorta is otherwise
grossly unremarkable. The inferior vena cava is grossly
unremarkable. No retroperitoneal lymphadenopathy is seen. No pelvic
sidewall lymphadenopathy is identified.

Reproductive: Mild bladder wall thickening may reflect relative
decompression, though would correlate clinically for any evidence of
cystitis. The patient is status post prostatectomy.

Other: No additional soft tissue abnormalities are seen.

Musculoskeletal: No acute osseous abnormalities are identified. The
visualized musculature is unremarkable in appearance.
IMPRESSION: 1. 6 mm stone at the base of bladder may reflect a recently passed
right-sided ureteral stone, given asymmetric prominence of the right
ureter. No evidence of hydronephrosis at this time.
2. Mild bladder wall thickening may reflect relative decompression,
though would correlate clinically for any evidence of cystitis. If
the patient's hematuria persists, cystoscopy could be considered to
exclude underlying mass.
3. Nonobstructing bilateral renal stones, larger on the left,
measuring up to 1.5 cm.
4. Scattered aortic atherosclerosis.
5. Scattered diverticulosis involving most of the colon, without
evidence of diverticulitis.
6. Diffuse coronary artery calcifications seen.
7. Mild scarring at the lung bases.

## 2017-04-11 DIAGNOSIS — R600 Localized edema: Secondary | ICD-10-CM | POA: Diagnosis not present

## 2017-04-11 DIAGNOSIS — R609 Edema, unspecified: Secondary | ICD-10-CM | POA: Diagnosis not present

## 2017-04-12 ENCOUNTER — Ambulatory Visit: Payer: Medicare Other | Admitting: Nurse Practitioner

## 2018-05-13 ENCOUNTER — Other Ambulatory Visit: Payer: Self-pay

## 2018-05-13 ENCOUNTER — Encounter (HOSPITAL_COMMUNITY)
Admission: EM | Disposition: A | Discharge status: Skilled Nursing Facility | Payer: Self-pay | Source: Skilled Nursing Facility | Attending: Internal Medicine

## 2018-05-13 ENCOUNTER — Emergency Department (HOSPITAL_COMMUNITY): Payer: Medicare Other

## 2018-05-13 ENCOUNTER — Inpatient Hospital Stay (HOSPITAL_COMMUNITY)
Admission: EM | Admit: 2018-05-13 | Discharge: 2018-05-16 | DRG: 195 | Disposition: A | Discharge status: Skilled Nursing Facility | Payer: Medicare Other | Source: Skilled Nursing Facility | Attending: Internal Medicine | Admitting: Internal Medicine

## 2018-05-13 ENCOUNTER — Encounter (HOSPITAL_COMMUNITY): Payer: Self-pay | Admitting: Emergency Medicine

## 2018-05-13 DIAGNOSIS — D631 Anemia in chronic kidney disease: Secondary | ICD-10-CM | POA: Diagnosis present

## 2018-05-13 DIAGNOSIS — F0390 Unspecified dementia without behavioral disturbance: Secondary | ICD-10-CM | POA: Diagnosis not present

## 2018-05-13 DIAGNOSIS — N183 Chronic kidney disease, stage 3 (moderate): Secondary | ICD-10-CM | POA: Diagnosis present

## 2018-05-13 DIAGNOSIS — Z66 Do not resuscitate: Secondary | ICD-10-CM | POA: Diagnosis not present

## 2018-05-13 DIAGNOSIS — Z8551 Personal history of malignant neoplasm of bladder: Secondary | ICD-10-CM

## 2018-05-13 DIAGNOSIS — Y95 Nosocomial condition: Secondary | ICD-10-CM | POA: Diagnosis present

## 2018-05-13 DIAGNOSIS — Z8673 Personal history of transient ischemic attack (TIA), and cerebral infarction without residual deficits: Secondary | ICD-10-CM | POA: Diagnosis not present

## 2018-05-13 DIAGNOSIS — E785 Hyperlipidemia, unspecified: Secondary | ICD-10-CM | POA: Diagnosis not present

## 2018-05-13 DIAGNOSIS — Z888 Allergy status to other drugs, medicaments and biological substances status: Secondary | ICD-10-CM

## 2018-05-13 DIAGNOSIS — E1165 Type 2 diabetes mellitus with hyperglycemia: Secondary | ICD-10-CM | POA: Diagnosis present

## 2018-05-13 DIAGNOSIS — R0789 Other chest pain: Secondary | ICD-10-CM

## 2018-05-13 DIAGNOSIS — Z7902 Long term (current) use of antithrombotics/antiplatelets: Secondary | ICD-10-CM | POA: Diagnosis not present

## 2018-05-13 DIAGNOSIS — Z8546 Personal history of malignant neoplasm of prostate: Secondary | ICD-10-CM | POA: Diagnosis not present

## 2018-05-13 DIAGNOSIS — R0902 Hypoxemia: Secondary | ICD-10-CM | POA: Diagnosis present

## 2018-05-13 DIAGNOSIS — I1310 Hypertensive heart and chronic kidney disease without heart failure, with stage 1 through stage 4 chronic kidney disease, or unspecified chronic kidney disease: Secondary | ICD-10-CM | POA: Diagnosis present

## 2018-05-13 DIAGNOSIS — M810 Age-related osteoporosis without current pathological fracture: Secondary | ICD-10-CM | POA: Diagnosis not present

## 2018-05-13 DIAGNOSIS — R7989 Other specified abnormal findings of blood chemistry: Secondary | ICD-10-CM | POA: Diagnosis present

## 2018-05-13 DIAGNOSIS — I10 Essential (primary) hypertension: Secondary | ICD-10-CM | POA: Diagnosis present

## 2018-05-13 DIAGNOSIS — Z79899 Other long term (current) drug therapy: Secondary | ICD-10-CM | POA: Diagnosis not present

## 2018-05-13 DIAGNOSIS — Z794 Long term (current) use of insulin: Secondary | ICD-10-CM | POA: Diagnosis not present

## 2018-05-13 DIAGNOSIS — J189 Pneumonia, unspecified organism: Principal | ICD-10-CM | POA: Diagnosis present

## 2018-05-13 DIAGNOSIS — Z79891 Long term (current) use of opiate analgesic: Secondary | ICD-10-CM | POA: Diagnosis not present

## 2018-05-13 DIAGNOSIS — E1122 Type 2 diabetes mellitus with diabetic chronic kidney disease: Secondary | ICD-10-CM | POA: Diagnosis present

## 2018-05-13 DIAGNOSIS — R079 Chest pain, unspecified: Secondary | ICD-10-CM | POA: Diagnosis present

## 2018-05-13 HISTORY — DX: Pneumonia, unspecified organism: J18.9

## 2018-05-13 LAB — CBC WITH DIFFERENTIAL/PLATELET
Abs Immature Granulocytes: 0.07 10*3/uL (ref 0.00–0.07)
Basophils Absolute: 0 10*3/uL (ref 0.0–0.1)
Basophils Relative: 0 %
EOS ABS: 0 10*3/uL (ref 0.0–0.5)
Eosinophils Relative: 0 %
HCT: 39 % (ref 39.0–52.0)
Hemoglobin: 12.5 g/dL — ABNORMAL LOW (ref 13.0–17.0)
Immature Granulocytes: 1 %
Lymphocytes Relative: 4 %
Lymphs Abs: 0.5 10*3/uL — ABNORMAL LOW (ref 0.7–4.0)
MCH: 30.6 pg (ref 26.0–34.0)
MCHC: 32.1 g/dL (ref 30.0–36.0)
MCV: 95.6 fL (ref 80.0–100.0)
Monocytes Absolute: 1.5 10*3/uL — ABNORMAL HIGH (ref 0.1–1.0)
Monocytes Relative: 11 %
Neutro Abs: 10.7 10*3/uL — ABNORMAL HIGH (ref 1.7–7.7)
Neutrophils Relative %: 84 %
PLATELETS: 207 10*3/uL (ref 150–400)
RBC: 4.08 MIL/uL — ABNORMAL LOW (ref 4.22–5.81)
RDW: 12.5 % (ref 11.5–15.5)
WBC: 12.8 10*3/uL — ABNORMAL HIGH (ref 4.0–10.5)
nRBC: 0 % (ref 0.0–0.2)

## 2018-05-13 LAB — COMPREHENSIVE METABOLIC PANEL
ALT: 18 U/L (ref 0–44)
AST: 20 U/L (ref 15–41)
Albumin: 3.6 g/dL (ref 3.5–5.0)
Alkaline Phosphatase: 40 U/L (ref 38–126)
Anion gap: 11 (ref 5–15)
BUN: 16 mg/dL (ref 8–23)
CO2: 23 mmol/L (ref 22–32)
CREATININE: 1.21 mg/dL (ref 0.61–1.24)
Calcium: 9.1 mg/dL (ref 8.9–10.3)
Chloride: 101 mmol/L (ref 98–111)
GFR calc non Af Amer: 54 mL/min — ABNORMAL LOW (ref >60)
Glucose, Bld: 300 mg/dL — ABNORMAL HIGH (ref 70–99)
Potassium: 4.1 mmol/L (ref 3.5–5.1)
SODIUM: 135 mmol/L (ref 135–145)
Total Bilirubin: 1.1 mg/dL (ref 0.3–1.2)
Total Protein: 6.6 g/dL (ref 6.5–8.1)

## 2018-05-13 LAB — PROTIME-INR
INR: 1.02
Prothrombin Time: 13.3 seconds (ref 11.4–15.2)

## 2018-05-13 LAB — CBG MONITORING, ED
Glucose-Capillary: 310 mg/dL — ABNORMAL HIGH (ref 70–99)
Glucose-Capillary: 253 mg/dL — ABNORMAL HIGH (ref 70–99)

## 2018-05-13 LAB — BRAIN NATRIURETIC PEPTIDE: B Natriuretic Peptide: 128.1 pg/mL — ABNORMAL HIGH (ref 0.0–100.0)

## 2018-05-13 LAB — MAGNESIUM: Magnesium: 1.6 mg/dL — ABNORMAL LOW (ref 1.7–2.4)

## 2018-05-13 LAB — TROPONIN I

## 2018-05-13 SURGERY — LEFT HEART CATH AND CORONARY ANGIOGRAPHY
Anesthesia: LOCAL

## 2018-05-13 MED ORDER — ACETAMINOPHEN 650 MG RE SUPP: 650.0000 mg | Freq: Four times a day (QID) | RECTAL | Status: DC | PRN

## 2018-05-13 MED ORDER — CALCIUM CARBONATE ANTACID 500 MG PO CHEW
2.0000 | CHEWABLE_TABLET | Freq: Four times a day (QID) | ORAL | Status: DC | PRN
  Administered 2018-05-15: 400 mg via ORAL
  Filled 2018-05-13 (×2): qty 2

## 2018-05-13 MED ORDER — INSULIN ASPART 100 UNIT/ML ~~LOC~~ SOLN
0.0000 [IU] | Freq: Three times a day (TID) | SUBCUTANEOUS | Status: DC
  Administered 2018-05-14 (×2): 2 [IU] via SUBCUTANEOUS
  Administered 2018-05-14 – 2018-05-16 (×4): 3 [IU] via SUBCUTANEOUS

## 2018-05-13 MED ORDER — SODIUM CHLORIDE 0.9 % IV SOLN
500.0000 mg | INTRAVENOUS | Status: DC
  Administered 2018-05-14 – 2018-05-15 (×3): 500 mg via INTRAVENOUS
  Filled 2018-05-13 (×4): qty 500

## 2018-05-13 MED ORDER — ENOXAPARIN SODIUM 40 MG/0.4ML ~~LOC~~ SOLN
40.0000 mg | SUBCUTANEOUS | Status: DC
  Administered 2018-05-14 – 2018-05-16 (×3): 40 mg via SUBCUTANEOUS
  Filled 2018-05-13 (×3): qty 0.4

## 2018-05-13 MED ORDER — LOSARTAN POTASSIUM 25 MG PO TABS
25.0000 mg | ORAL_TABLET | Freq: Every day | ORAL | Status: DC
  Administered 2018-05-14 – 2018-05-16 (×3): 25 mg via ORAL
  Filled 2018-05-13 (×3): qty 1

## 2018-05-13 MED ORDER — CLOPIDOGREL BISULFATE 75 MG PO TABS
75.0000 mg | ORAL_TABLET | Freq: Every day | ORAL | Status: DC
  Administered 2018-05-14 – 2018-05-16 (×3): 75 mg via ORAL
  Filled 2018-05-13 (×3): qty 1

## 2018-05-13 MED ORDER — ASPIRIN 81 MG PO CHEW: 324.0000 mg | CHEWABLE_TABLET | Freq: Once | ORAL | Status: DC

## 2018-05-13 MED ORDER — SODIUM CHLORIDE 0.9 % IV SOLN: 1.0000 g | INTRAVENOUS | Status: DC

## 2018-05-13 MED ORDER — ATORVASTATIN CALCIUM 10 MG PO TABS
10.0000 mg | ORAL_TABLET | Freq: Every day | ORAL | Status: DC
  Administered 2018-05-14 – 2018-05-16 (×3): 10 mg via ORAL
  Filled 2018-05-13 (×3): qty 1

## 2018-05-13 MED ORDER — ACETAMINOPHEN 325 MG PO TABS
650.0000 mg | ORAL_TABLET | Freq: Four times a day (QID) | ORAL | Status: DC | PRN
  Administered 2018-05-14: 650 mg via ORAL
  Filled 2018-05-13: qty 2

## 2018-05-13 MED ORDER — ONDANSETRON HCL 4 MG/2ML IJ SOLN: 4.0000 mg | Freq: Four times a day (QID) | INTRAMUSCULAR | Status: DC | PRN

## 2018-05-13 MED ORDER — SODIUM CHLORIDE 0.9 % IV SOLN
1.0000 g | Freq: Three times a day (TID) | INTRAVENOUS | Status: DC
  Administered 2018-05-13: 1 g via INTRAVENOUS
  Filled 2018-05-13 (×2): qty 1

## 2018-05-13 MED ORDER — INSULIN GLARGINE 100 UNIT/ML ~~LOC~~ SOLN
22.0000 [IU] | Freq: Every day | SUBCUTANEOUS | Status: DC
  Administered 2018-05-14 – 2018-05-16 (×3): 22 [IU] via SUBCUTANEOUS
  Filled 2018-05-13 (×4): qty 0.22

## 2018-05-13 MED ORDER — FUROSEMIDE 20 MG PO TABS
20.0000 mg | ORAL_TABLET | Freq: Every day | ORAL | Status: DC
  Administered 2018-05-14 – 2018-05-16 (×3): 20 mg via ORAL
  Filled 2018-05-13 (×3): qty 1

## 2018-05-13 MED ORDER — CITALOPRAM HYDROBROMIDE 20 MG PO TABS
20.0000 mg | ORAL_TABLET | Freq: Every day | ORAL | Status: DC
  Administered 2018-05-14 (×2): 20 mg via ORAL
  Filled 2018-05-13: qty 2
  Filled 2018-05-13 (×2): qty 1

## 2018-05-13 MED ORDER — GABAPENTIN 100 MG PO CAPS
100.0000 mg | ORAL_CAPSULE | Freq: Two times a day (BID) | ORAL | Status: DC
  Administered 2018-05-14 – 2018-05-16 (×4): 100 mg via ORAL
  Filled 2018-05-13 (×5): qty 1

## 2018-05-13 MED ORDER — SODIUM CHLORIDE 0.9 % IV SOLN
1.0000 g | INTRAVENOUS | Status: DC
  Administered 2018-05-14 – 2018-05-16 (×3): 1 g via INTRAVENOUS
  Filled 2018-05-13 (×4): qty 10

## 2018-05-13 MED ORDER — ONDANSETRON HCL 4 MG PO TABS: 4.0000 mg | ORAL_TABLET | Freq: Four times a day (QID) | ORAL | Status: DC | PRN

## 2018-05-13 MED ORDER — TRAMADOL HCL 50 MG PO TABS
50.0000 mg | ORAL_TABLET | Freq: Every day | ORAL | Status: DC
  Administered 2018-05-14 – 2018-05-16 (×3): 50 mg via ORAL
  Filled 2018-05-13 (×3): qty 1

## 2018-05-13 MED ORDER — VANCOMYCIN HCL IN DEXTROSE 1-5 GM/200ML-% IV SOLN
1000.0000 mg | Freq: Once | INTRAVENOUS | Status: AC
  Administered 2018-05-13: 1000 mg via INTRAVENOUS
  Filled 2018-05-13: qty 200

## 2018-05-13 MED ORDER — VITAMIN B-12 1000 MCG PO TABS
2000.0000 ug | ORAL_TABLET | Freq: Every day | ORAL | Status: DC
  Administered 2018-05-14 – 2018-05-16 (×3): 2000 ug via ORAL
  Filled 2018-05-13 (×3): qty 2

## 2018-05-13 NOTE — ED Triage Notes (Signed)
Pt BIB [facility] from [facility], c/o chest pain that started last night, and TIA symptoms x 2 days ago. Given 324mg  aspirin PTA. Pt short with answers, rates pain as "mild" at this time.

## 2018-05-13 NOTE — ED Provider Notes (Signed)
[facility] EMERGENCY DEPARTMENT Provider Note   CSN: [id number] Arrival date & time: 05/13/18  1724     History   Chief Complaint Chief Complaint  Patient presents with  . Chest Pain    HPI [name] is a 85 y.o. male.  HPI Notable medical issues including dementia presents from his nursing facility after complaining of chest pain. Patient is a very poor historian, is intermittently interactive, but states that his pain is currently mild. Pain seems to be pressure-like, across the anterior superior chest, began yesterday. Patient is a nursing home resident. He has a notable history of dementia, as above, as well as recent TIA Reportedly the patient is currently taking Plavix. The patient arrived as a code STEMI. The patient seems to describe his current presentation well, history is obtained by EMS, and there are some limitations given the patient's history of dementia.   Past Medical History:  Diagnosis Date  . Bladder cancer ([place])   . Dementia ([place])   . Diabetes mellitus without complication ([place])   . Hyperlipidemia   . Hypertension   . Osteoporosis   . Prostate CA [facility])    HISTORY OF    Patient Active Problem List   Diagnosis Date Noted  . HTN (hypertension) 06/12/2016  . Hyperlipemia 06/12/2016  . Abnormality of gait 06/12/2016  . Aphasia   . TIA (transient ischemic attack) 04/14/2016    Past Surgical History:  Procedure Laterality Date  . PROSTATE SURGERY    . rotator cuff surgery    . TONSILLECTOMY          Home Medications    Prior to Admission medications   Medication Sig Start Date End Date Taking? Authorizing Provider  acetaminophen (TYLENOL) 325 MG tablet Take 650 mg by mouth every 6 (six) hours as needed for mild pain or fever.    [provider]  alendronate (FOSAMAX) 70 MG tablet Take 70 mg by mouth once a week. Take with a full glass of water on an empty stomach.    [provider]  atorvastatin  (LIPITOR) 10 MG tablet Take 10 mg by mouth daily.    [provider]  cholecalciferol (VITAMIN D) 1000 units tablet Take 2,000 Units by mouth daily.     [provider]  clopidogrel (PLAVIX) 75 MG tablet Take 1 tablet (75 mg total) by mouth daily. 04/15/16   Velna Ochs, MD  donepezil (ARICEPT) 10 MG tablet Take 10 mg by mouth at bedtime. 12/06/15   [provider]  insulin aspart (NOVOLOG) 100 UNIT/ML injection Inject 0-11 Units into the skin 3 (three) times daily before meals. Sliding scale: <70 or >400: notify MD, 0- 69: 0 units, 70-199: 10 units, 200-299: 11 units, 300-399:12 units    [provider]  insulin glargine (LANTUS) 100 UNIT/ML injection Inject 32 Units into the skin daily.     [provider]  losartan (COZAAR) 25 MG tablet Take 25 mg by mouth daily.    [provider]  Melatonin 5 MG TABS Take 5 mg by mouth at bedtime as needed (sleep).    [provider]  nystatin cream (MYCOSTATIN) Apply 1 application topically 2 (two) times daily as needed for dry skin.    [provider]  ondansetron (ZOFRAN) 4 MG tablet Take 1 tablet (4 mg total) by mouth every 6 (six) hours. 06/29/16   Long, Wonda Olds, MD  tamsulosin (FLOMAX) 0.4 MG CAPS capsule Take 1 capsule (0.4 mg total)  by mouth daily. 06/29/16   Long, Wonda Olds, MD  triamcinolone cream (KENALOG) 0.1 % Apply 1 application topically 3 (three) times daily as needed.    [provider]    Family History No family history on file.  Social History Social History   Tobacco Use  . Smoking status: Never Smoker  . Smokeless tobacco: Never Used  Substance Use Topics  . Alcohol use: No    Alcohol/week: 0.0 standard drinks  . Drug use: No     Allergies   Actos [pioglitazone]   Review of Systems Review of Systems  Unable to perform ROS: Dementia  Dementia limits some of the accuracy of the ROS, but details, per HPI as available.  Physical  Exam Updated Vital Signs BP 108/64   Pulse (!) 106   Resp (!) 21   SpO2 95%   Physical Exam Vitals signs and nursing note reviewed.  Constitutional:      General: He is not in acute distress.    Appearance: He is ill-appearing.  HENT:     Head: Normocephalic and atraumatic.  Eyes:     Conjunctiva/sclera: Conjunctivae normal.  Cardiovascular:     Rate and Rhythm: Normal rate and regular rhythm.     Heart sounds: Murmur present.  Pulmonary:     Breath sounds: Decreased air movement present. No stridor.  Abdominal:     General: There is no distension.  Skin:    General: Skin is warm and dry.  Neurological:     Mental Status: He is alert.     Motor: Atrophy present.  Psychiatric:        Cognition and Memory: Cognition is impaired.      ED Treatments / Results  Labs (all labs ordered are listed, but only abnormal results are displayed) Labs Reviewed  COMPREHENSIVE METABOLIC PANEL - Abnormal; Notable for the following components:      Result Value   Glucose, Bld 300 (*)    GFR calc non Af Amer 54 (*)    All other components within normal limits  MAGNESIUM - Abnormal; Notable for the following components:   Magnesium 1.6 (*)    All other components within normal limits  BRAIN NATRIURETIC PEPTIDE - Abnormal; Notable for the following components:   B Natriuretic Peptide 128.1 (*)    All other components within normal limits  CBC WITH DIFFERENTIAL/PLATELET - Abnormal; Notable for the following components:   WBC 12.8 (*)    RBC 4.08 (*)    Hemoglobin 12.5 (*)    Neutro Abs 10.7 (*)    Lymphs Abs 0.5 (*)    Monocytes Absolute 1.5 (*)    All other components within normal limits  CBG MONITORING, ED - Abnormal; Notable for the following components:   Glucose-Capillary 310 (*)    All other components within normal limits  CULTURE, BLOOD (ROUTINE X 2)  CULTURE, BLOOD (ROUTINE X 2)  TROPONIN I  PROTIME-INR    EKG EKG Interpretation  Date/Time:  Monday May 13 2018 17:30:57 EST Ventricular Rate:  113 PR Interval:    QRS Duration: 92 QT Interval:  336 QTC Calculation: 461 R Axis:   24 Text Interpretation:  Sinus tachycardia Ventricular premature complex ST-t wave abnormality , elevation in inferior leads Abnormal ekg Confirmed by Carmin Muskrat [phone]) on 05/13/2018 5:57:07 PM   Radiology Dg Chest Portable 1 View  Result Date: 05/13/2018 CLINICAL DATA:  Chest pain EXAM: PORTABLE CHEST 1 VIEW COMPARISON:  December 17, 2015 FINDINGS: There  is patchy infiltrate in the left base. There is mild atelectatic change in the right base. Lungs elsewhere clear. Heart size and pulmonary vascularity are normal. No adenopathy. There is aortic atherosclerosis. No bone lesions. No pneumothorax. IMPRESSION: Patchy infiltrate consistent with pneumonia left base. Mild right base atelectasis. Heart size normal. There is aortic atherosclerosis. Aortic Atherosclerosis (ICD10-I70.0). Electronically Signed   By: Lowella Grip III M.D.   On: 05/13/2018 18:00    Procedures Procedures (including critical care time)  Medications Ordered in ED Medications  aspirin chewable tablet 324 mg (324 mg Oral Not Given 05/13/18 1749)  ceFEPIme (MAXIPIME) 1 g in sodium chloride 0.9 % 100 mL IVPB (has no administration in time range)     Initial Impression / Assessment and Plan / ED Course  I have reviewed the triage vital signs and the nursing notes.  Pertinent labs & imaging results that were available during my care of the patient were reviewed by me and considered in my medical decision making (see chart for details).    Patient arrives as a code STEMI. However, soon after initial events the patient indicates he is not amenable to any emergent interventions.  7:35 PM Patient states that his pain continues to improve. He is accompanied by his wife and a daughter. We discussed all findings including labs notable for leukocytosis, x-ray concerning for pneumonia, which I have  reviewed, agree with the interpretation. Patient has persistent tachycardia, mild hypoxia, with oxygen saturation of 92, 93% on room air Patient does not have history of intrinsic pulmonary disease. Patient improved with nasal cannula. Given concern for pneumonia, chest pain, and his risk profile, the patient started antibiotics, broad-spectrum, ceftriaxone, vancomycin, was admitted for further evaluation and management. With normal troponin, duration of pain, ongoing use of Plavix, low suspicion for atypical ACS.  Final Clinical Impressions(s) / ED Diagnoses  Healthcare acquired pneumonia  CRITICAL CARE Performed by: Carmin Muskrat Total critical care time: 35 minutes Critical care time was exclusive of separately billable procedures and treating other patients. Critical care was necessary to treat or prevent imminent or life-threatening deterioration. Critical care was time spent personally by me on the following activities: development of treatment plan with patient and/or surrogate as well as nursing, discussions with consultants, evaluation of patient's response to treatment, examination of patient, obtaining history from patient or surrogate, ordering and performing treatments and interventions, ordering and review of laboratory studies, ordering and review of radiographic studies, pulse oximetry and re-evaluation of patient's condition.    Carmin Muskrat, MD 05/13/18 [phone]

## 2018-05-13 NOTE — H&P (Signed)
History and Physical    KESSLER SOLLY TWK:[id number] DOB: Sep 19, 1929 DOA: 05/13/2018  PCP: Javier Glazier, MD  Patient coming from: [place].  [place].  Chief Complaint: Chest pain.  HPI: [name] is a 85 y.o. male with history of dementia, diabetes mellitus type 2, previous TIA, bladder cancer, hypertension was brought to the ER after patient complained of some abdominal and chest discomfort this afternoon.  Patient stated to his son that he had some similar complaints also yesterday.  Did not have any nausea vomiting shortness of breath productive cough or any diarrhea.  Per patient's son patient had brief episode of difficulty talking last week which lasted for half an hour something which was concerning for TIA but did not pursue further work-up at that time.  ED Course: In the ER a code STEMI was initially called since EKG was showing some ST elevation in the inferior leads.  Dr. Ellyn Hack cardiologist was consulted and since patient was not pursuing any further intervention and patient also had recent TIA with chest x-ray at this time showing infiltrates concerning for pneumonia code STEMI was canceled.  On exam patient is chest pain-free and resting.  EKG shows sinus tachycardia with some inferior ST elevations minimally.  Troponin initially was negative.  Labs show leukocytosis.  Patient was started on empiric antibiotics for pneumonia.  Review of Systems: As per HPI, rest all negative.   Past Medical History:  Diagnosis Date  . Bladder cancer ([place])   . Dementia ([place])   . Diabetes mellitus without complication ([place])   . Hyperlipidemia   . Hypertension   . Osteoporosis   . Prostate CA ([place])    HISTORY OF    Past Surgical History:  Procedure Laterality Date  . PROSTATE SURGERY    . rotator cuff surgery    . TONSILLECTOMY       reports that he has never smoked. He has never used smokeless tobacco. He reports that he does not drink alcohol or  use drugs.  Allergies  Allergen Reactions  . Actos [Pioglitazone]     Rash     Family History  Family history unknown: Yes    Prior to Admission medications   Medication Sig Start Date End Date Taking? Authorizing Provider  acetaminophen (TYLENOL) 500 MG tablet Take 500-1,000 mg by mouth See admin instructions. Take 500 mg twice daily and 1000 mg every night at bedtime.   Yes [provider]  ammonium lactate (AMLACTIN) 12 % cream Apply 1 g topically 2 (two) times daily. Apply to lower extremities    Yes [provider]  atorvastatin (LIPITOR) 10 MG tablet Take 10 mg by mouth daily.   Yes [provider]  calcium carbonate (TUMS) 500 MG chewable tablet Chew 2 tablets by mouth 4 (four) times daily as needed for indigestion or heartburn.   Yes [provider]  Cholecalciferol (VITAMIN D) 50 MCG (2000 UT) tablet Take 2,000 Units by mouth daily.    Yes [provider]  citalopram (CELEXA) 20 MG tablet Take 20 mg by mouth at bedtime.   Yes [provider]  clopidogrel (PLAVIX) 75 MG tablet Take 1 tablet (75 mg total) by mouth daily. 04/15/16  Yes Velna Ochs, MD  furosemide (LASIX) 20 MG tablet Take 20 mg by mouth daily.   Yes [provider]  gabapentin (NEURONTIN) 100 MG capsule Take 100 mg by mouth 2 (two) times daily.   Yes [provider]  insulin aspart (NOVOLOG) 100 UNIT/ML injection Inject 14-17 Units into the skin See admin instructions. Inject 14-17 units subcutaneously four times daily per sliding scale: CBG 70-199 14 units, 200-299 15 units, 300-399 16 units, >400 17 units.   Yes [provider]  insulin glargine (LANTUS) 100 UNIT/ML injection Inject 22 Units into the skin daily.    Yes [provider]  losartan (COZAAR) 25 MG tablet Take 25 mg by mouth daily.   Yes [provider]  miconazole (BAZA ANTIFUNGAL) 2 % cream Apply 1 application topically 3 (three) times daily.   Yes  [provider]  traMADol (ULTRAM) 50 MG tablet Take 25-50 mg by mouth daily.   Yes [provider]  vitamin B-12 (CYANOCOBALAMIN) 1000 MCG tablet Take 2,000 mcg by mouth daily.   Yes [provider]  ondansetron (ZOFRAN) 4 MG tablet Take 1 tablet (4 mg total) by mouth every 6 (six) hours. Patient not taking: Reported on 05/13/2018 06/29/16   Long, Wonda Olds, MD  tamsulosin (FLOMAX) 0.4 MG CAPS capsule Take 1 capsule (0.4 mg total) by mouth daily. Patient not taking: Reported on 05/13/2018 06/29/16   Margette Fast, MD    Physical Exam: Vitals:   05/13/18 1730 05/13/18 1731 05/13/18 1845 05/13/18 2000  BP: 137/72 137/72 108/64   Pulse: (!) 111 (!) 113 (!) 106   Resp: 19 18 (!) 21   SpO2: 91% 92% 95%   Weight:    78 kg  Height:    5\' 8"  (1.727 m)      Constitutional: Moderately built and nourished. Vitals:   05/13/18 1730 05/13/18 1731 05/13/18 1845 05/13/18 2000  BP: 137/72 137/72 108/64   Pulse: (!) 111 (!) 113 (!) 106   Resp: 19 18 (!) 21   SpO2: 91% 92% 95%   Weight:    78 kg  Height:    5\' 8"  (1.727 m)   Eyes: Anicteric no pallor. ENMT: No discharge from the ears eyes nose or mouth. Neck: No mass felt.  No neck rigidity.  No JVD appreciated. Respiratory: No rhonchi or crepitations. Cardiovascular: S1-S2 heard. Abdomen: Soft nontender bowel sounds present. Musculoskeletal: No edema.  No joint effusion. Skin: No rash. Neurologic: Alert awake oriented to his name.  Moves all extremities. Psychiatric: Has severe dementia.   Labs on Admission: I have personally reviewed following labs and imaging studies  CBC: Recent Labs  Lab 05/13/18 1742  WBC 12.8*  NEUTROABS 10.7*  HGB 12.5*  HCT 39.0  MCV 95.6  PLT 856   Basic Metabolic Panel: Recent Labs  Lab 05/13/18 1742  NA 135  K 4.1  CL 101  CO2 23  GLUCOSE 300*  BUN 16  CREATININE 1.21  CALCIUM 9.1  MG 1.6*   GFR: Estimated Creatinine Clearance: 42.4 mL/min (by C-G formula based on  SCr of 1.21 mg/dL). Liver Function Tests: Recent Labs  Lab 05/13/18 1742  AST 20  ALT 18  ALKPHOS 40  BILITOT 1.1  PROT 6.6  ALBUMIN 3.6   No results for input(s): LIPASE, AMYLASE in the last 168 hours. No results for input(s): AMMONIA in the last 168 hours. Coagulation Profile: Recent Labs  Lab 05/13/18 1742  INR 1.02   Cardiac Enzymes: Recent Labs  Lab 05/13/18 1742  TROPONINI <0.03   BNP (last 3 results) No results for input(s): PROBNP in the last 8760 hours. HbA1C: No results for input(s): HGBA1C in the last 72 hours. CBG: Recent Labs  Lab 05/13/18 1802 05/13/18 2053  GLUCAP 310* 253*   Lipid Profile: No results for input(s): CHOL, HDL, LDLCALC, TRIG, CHOLHDL, LDLDIRECT in the last 72 hours. Thyroid Function Tests: No results for input(s): TSH, T4TOTAL, FREET4, T3FREE, THYROIDAB in the last 72 hours. Anemia Panel: No results for input(s): VITAMINB12, FOLATE, FERRITIN, TIBC, IRON, RETICCTPCT in the last 72 hours. Urine analysis:    Component Value Date/Time   COLORURINE RED (A) 06/29/2016 2137   APPEARANCEUR TURBID (A) 06/29/2016 2137   LABSPEC  06/29/2016 2137    TEST NOT REPORTED DUE TO COLOR INTERFERENCE OF URINE PIGMENT   PHURINE  06/29/2016 2137    TEST NOT REPORTED DUE TO COLOR INTERFERENCE OF URINE PIGMENT   GLUCOSEU (A) 06/29/2016 2137    TEST NOT REPORTED DUE TO COLOR INTERFERENCE OF URINE PIGMENT   HGBUR (A) 06/29/2016 2137    TEST NOT REPORTED DUE TO COLOR INTERFERENCE OF URINE PIGMENT   BILIRUBINUR (A) 06/29/2016 2137    TEST NOT REPORTED DUE TO COLOR INTERFERENCE OF URINE PIGMENT   KETONESUR (A) 06/29/2016 2137    TEST NOT REPORTED DUE TO COLOR INTERFERENCE OF URINE PIGMENT   PROTEINUR (A) 06/29/2016 2137    TEST NOT REPORTED DUE TO COLOR INTERFERENCE OF URINE PIGMENT   NITRITE (A) 06/29/2016 2137    TEST NOT REPORTED DUE TO COLOR INTERFERENCE OF URINE PIGMENT   LEUKOCYTESUR (A) 06/29/2016 2137    TEST NOT REPORTED DUE TO COLOR  INTERFERENCE OF URINE PIGMENT   Sepsis Labs: @LABRCNTIP (procalcitonin:4,lacticidven:4) )No results found for this or any previous visit (from the past 240 hour(s)).   Radiological Exams on Admission: Dg Chest Portable 1 View  Result Date: 05/13/2018 CLINICAL DATA:  Chest pain EXAM: PORTABLE CHEST 1 VIEW COMPARISON:  December 17, 2015 FINDINGS: There is patchy infiltrate in the left base. There is mild atelectatic change in the right base. Lungs elsewhere clear. Heart size and pulmonary vascularity are normal. No adenopathy. There is aortic atherosclerosis. No bone lesions. No pneumothorax. IMPRESSION: Patchy infiltrate consistent with pneumonia left base. Mild right base atelectasis. Heart size normal. There is aortic atherosclerosis. Aortic Atherosclerosis (ICD10-I70.0). Electronically Signed   By: Lowella Grip III M.D.   On: 05/13/2018 18:00    EKG: Independently reviewed.  Sinus tachycardia with with ST-T changes in the inferior leads.  Assessment/Plan Principal Problem:   CAP (community acquired pneumonia) Active Problems:   HTN (hypertension)   HLD (hyperlipidemia)   Uncontrolled type 2 diabetes mellitus with hyperglycemia (HCC)   Chest pain    1. Pneumonia -patient has been placed on ceftriaxone and Zithromax.  Follow cultures. 2. Possible ST elevation MI -denies any chest pain at this time.  Appreciate cardiology input.  Patient and family has requested no interventions so conservative management.  Patient is already on Plavix statins and will add metoprolol.  Cycle cardiac markers.  Did not order any 2D echo since patient family did not want any active interventions. 3. History of TIA and possible recent TIA attack last week.  Appears nonfocal at this time.  Continue Plavix and statin. 4. Hyperlipidemia on statins. 5. Dementia on Aricept. 6. Diabetes mellitus type 2 uncontrolled with hyperglycemia on Lantus and sliding scale.  7. Chronic anemia -follow CBC. 8. Elevated  creatinine likely from chronic kidney disease.  Follow metabolic panel.  Note that patient is on Lasix and Cozaar. 9. Hypertension on Lasix and Cozaar. 10. Grade 1 diastolic dysfunction with EF of 60 to 65% per 2D echo done in December 2017.  On Lasix appears compensated.  11. Hypomagnesemia -replace and recheck   DVT prophylaxis: Lovenox. Code Status: DNR. Family Communication: Patient's son. Disposition Plan: Back to facility once stable. Consults called: Cardiology. Admission status: Inpatient.   Rise Patience MD [facility] Pager [phone].  If 7PM-7AM, please contact night-coverage [email or web address] Password [facility]  05/13/2018, 10:38 PM

## 2018-05-13 NOTE — ED Notes (Signed)
Pt soiled brief removed, replace with new brief after x2 failed attempts at placing condom cath

## 2018-05-13 NOTE — Progress Notes (Signed)
Pharmacy Antibiotic Note  [name] is a 85 y.o. male admitted on 05/13/2018 with CP and concern for PNA.  Pharmacy has been consulted for Vancomycin dosing along with Cefepime per MD. SCr 1.21, CrCl~40-50 ml/min.   Vancomycin 1000 mg IV Q 24 hrs. Goal AUC 400-550. Expected AUC: 449 SCr used: 1.21  Plan: - Vancomycin 1g IV x 1 followed by 1g IV every 24 hours - Cefepime 1g IV every 8 hours per MD - Will continue to follow renal function, culture results, LOT, and antibiotic de-escalation plans      No data recorded.  Recent Labs  Lab 05/13/18 1742  WBC 12.8*  CREATININE 1.21    CrCl cannot be calculated (Unknown ideal weight.).    Allergies  Allergen Reactions  . Actos [Pioglitazone]     Rash     Antimicrobials this admission: Vanc 1/6 >> Cefepime 1/6 >>  Dose adjustments this admission: n/a  Microbiology results: 1/6 BCx >>  Thank you for allowing pharmacy to be a part of this patient's care.  Alycia Rossetti, PharmD, BCPS Clinical Pharmacist Please check AMION for all [place] numbers 05/13/2018 8:33 PM

## 2018-05-13 NOTE — Progress Notes (Signed)
    Brief IC Cardiology note   Code STEMI called in the field - 85 y/o man 2 d s/p ? TIA (is DNR/DNI).  Subtle STE in septal leads not quite diagnostic of ST elevation MI.  Upon arrival the patient says he is having "a little bit of chest pain "that would not be consistent with an anterior STEMI.  I then discussed with him based on his recent TIA and DNR his desires for potential invasive procedures.  He indicated to me that he would not be interested in any invasive procedures including cardiac catheterization and possible PCI.  Based on this discussion, he would not be an interventional candidate.  Code STEMI was canceled.  He will be seen by the emergency room team and will consider general cardiology consultation if indicated.   Glenetta Hew, MD

## 2018-05-14 DIAGNOSIS — R079 Chest pain, unspecified: Secondary | ICD-10-CM

## 2018-05-14 DIAGNOSIS — I10 Essential (primary) hypertension: Secondary | ICD-10-CM

## 2018-05-14 DIAGNOSIS — J189 Pneumonia, unspecified organism: Principal | ICD-10-CM

## 2018-05-14 DIAGNOSIS — E1165 Type 2 diabetes mellitus with hyperglycemia: Secondary | ICD-10-CM

## 2018-05-14 LAB — BASIC METABOLIC PANEL WITH GFR
Anion gap: 6 (ref 5–15)
BUN: 16 mg/dL (ref 8–23)
CO2: 27 mmol/L (ref 22–32)
Calcium: 9.1 mg/dL (ref 8.9–10.3)
Chloride: 105 mmol/L (ref 98–111)
Creatinine, Ser: 1.17 mg/dL (ref 0.61–1.24)
GFR calc Af Amer: >60 mL/min
GFR calc non Af Amer: 56 mL/min — ABNORMAL LOW
Glucose, Bld: 245 mg/dL — ABNORMAL HIGH (ref 70–99)
Potassium: 4.6 mmol/L (ref 3.5–5.1)
Sodium: 138 mmol/L (ref 135–145)

## 2018-05-14 LAB — GLUCOSE, CAPILLARY
GLUCOSE-CAPILLARY: 254 mg/dL — AB (ref 70–99)
Glucose-Capillary: 187 mg/dL — ABNORMAL HIGH (ref 70–99)
Glucose-Capillary: 191 mg/dL — ABNORMAL HIGH (ref 70–99)

## 2018-05-14 LAB — CBG MONITORING, ED: Glucose-Capillary: 236 mg/dL — ABNORMAL HIGH (ref 70–99)

## 2018-05-14 LAB — MAGNESIUM: Magnesium: 1.7 mg/dL (ref 1.7–2.4)

## 2018-05-14 LAB — CBC
HCT: 38.4 % — ABNORMAL LOW (ref 39.0–52.0)
Hemoglobin: 11.9 g/dL — ABNORMAL LOW (ref 13.0–17.0)
MCH: 30 pg (ref 26.0–34.0)
MCHC: 31 g/dL (ref 30.0–36.0)
MCV: 96.7 fL (ref 80.0–100.0)
Platelets: 200 10*3/uL (ref 150–400)
RBC: 3.97 MIL/uL — ABNORMAL LOW (ref 4.22–5.81)
RDW: 12.5 % (ref 11.5–15.5)
WBC: 9.4 10*3/uL (ref 4.0–10.5)
nRBC: 0 % (ref 0.0–0.2)

## 2018-05-14 LAB — INFLUENZA PANEL BY PCR (TYPE A & B)
Influenza A By PCR: NEGATIVE
Influenza B By PCR: NEGATIVE

## 2018-05-14 LAB — TROPONIN I
Troponin I: 0.03 ng/mL (ref <0.03)
Troponin I: 0.03 ng/mL

## 2018-05-14 LAB — MRSA PCR SCREENING: MRSA by PCR: NEGATIVE

## 2018-05-14 LAB — TSH: TSH: 0.744 u[IU]/mL (ref 0.350–4.500)

## 2018-05-14 MED ORDER — ALUM & MAG HYDROXIDE-SIMETH 200-200-20 MG/5ML PO SUSP
30.0000 mL | Freq: Once | ORAL | Status: AC
  Administered 2018-05-14: 30 mL via ORAL
  Filled 2018-05-14: qty 30

## 2018-05-14 MED ORDER — MORPHINE SULFATE (PF) 2 MG/ML IV SOLN
2.0000 mg | Freq: Once | INTRAVENOUS | Status: AC
  Administered 2018-05-14: 2 mg via INTRAVENOUS
  Filled 2018-05-14: qty 1

## 2018-05-14 MED ORDER — METOPROLOL TARTRATE 12.5 MG HALF TABLET
12.5000 mg | ORAL_TABLET | Freq: Two times a day (BID) | ORAL | Status: DC
  Administered 2018-05-14 – 2018-05-16 (×4): 12.5 mg via ORAL
  Filled 2018-05-14 (×5): qty 1

## 2018-05-14 NOTE — ED Notes (Signed)
Patient CBG was 236, The Nurse was informed.

## 2018-05-14 NOTE — ED Notes (Signed)
Family asked to wait on the flu swab until the morning after the pt gets some rest to avoid agitating the pt because of his [employer].

## 2018-05-14 NOTE — ED Notes (Signed)
bfast tray ordered 

## 2018-05-14 NOTE — Progress Notes (Addendum)
2110- Pt having dull epigastric chest pain 8/10. MD paged. Orders received for Maalox PO once.  2130- Pt sitting in chair, after pt moved back to bed pt starts grabbing   Mid chest and moaning due to chest discomfort. Pt has dementia and is unable to describe chest pain to me at this time but has his eyes closed tight and both hands on chest. MD paged. Orders received for Morphine 2mg  IV once and EKG. Will continue to monitor patient.   2230- Pt now resting with eyes closed in bed, Chest pain seems to have eased and pt stated he would like to take maalox later on in the night if epigastric pain comes back. MD aware. Will continue to monitor patient.

## 2018-05-14 NOTE — Progress Notes (Signed)
PROGRESS NOTE                                                                                                                                                                                                             Patient Demographics:    [name], is a 85 y.o. male, DOB - [birth year], WHQ:[id number]  Admit date - 05/13/2018   Admitting Physician Rise Patience, MD  Outpatient Primary MD for the patient is Javier Glazier, MD  LOS - 1  Chief Complaint  Patient presents with  . Chest Pain       Brief Narrative  [name] is a 85 y.o. male with history of dementia, diabetes mellitus type 2, previous TIA, bladder cancer, hypertension was brought to the ER after patient complained of some abdominal and chest discomfort this afternoon. Patient stated to his son that he had some similar complaints also yesterday. Did not have any nausea vomiting shortness of breath productive cough or any diarrhea.  In the ER  there was question of STEMI.  Cardiology was consulted.  Family chose minimal conservative treatment.  Clinically STEMI has been ruled out.    Subjective:    [name] today has, No headache, No chest pain, No abdominal pain - No Nausea, No new weakness tingling or numbness, No Cough - SOB.     Assessment  & Plan :      1.CAP -left-sided, parents with his atypical left-sided chest pain.  Agree with empiric IV Rocephin and azithromycin, continue supportive care and follow cultures.  2.  Questionable STEMI.  Clinically ruled out.  Troponin trend not consistent with true STEMI.  Continue Plavix, statin and beta-blocker for secondary mention, seen by cardiology, chest pain-free, cardiogram to evaluate EF and wall motion.  Do not think this was a true STEMI.  Family wishes gentle medical care only.  3.  Dyslipidemia.  On statin.  4.  Dementia.  On Aricept, at risk for delirium, minimize narcotic and benzodiazepine use.  5.  History of TIA.  On Plavix  and statin.  No acute issue.  6.  Anemia of chronic disease.  Stable.  7.  Hypertension.  Stable on beta-blocker, Lasix and ARB.  8.  Diastolic dysfunction with EF of 60% on last echocardiogram in 2017 December.  Stable.  9.  Hypomagnesemia.  Replaced will recheck in the morning.  10.  CKD 3.  Stable.  11.  DM type II.  On Lantus and sliding scale.  CBG (last 3)  Recent Labs    05/13/18 2053 05/14/18 0747 05/14/18 1210  GLUCAP 253* 236* 187*       Family Communication  :  son  Code Status :  DNR  Disposition Plan  :  TBD  Consults  :  Cards  Procedures  :    TTE   DVT Prophylaxis  :  Lovenox    Lab Results  Component Value Date   PLT 200 05/14/2018    Diet :  Diet Order            Diet heart healthy/carb modified Room service appropriate? Yes; Fluid consistency: Thin; Fluid restriction: 1200 mL Fluid  Diet effective now               Inpatient Medications Scheduled Meds: . aspirin  324 mg Oral Once  . atorvastatin  10 mg Oral Daily  . citalopram  20 mg Oral QHS  . clopidogrel  75 mg Oral Daily  . enoxaparin (LOVENOX) injection  40 mg Subcutaneous Q24H  . furosemide  20 mg Oral Daily  . gabapentin  100 mg Oral BID  . insulin aspart  0-9 Units Subcutaneous TID WC  . insulin glargine  22 Units Subcutaneous Daily  . losartan  25 mg Oral Daily  . metoprolol tartrate  12.5 mg Oral BID  . traMADol  50 mg Oral Daily  . vitamin B-12  2,000 mcg Oral Daily   Continuous Infusions: . azithromycin Stopped (05/14/18 0143)  . cefTRIAXone (ROCEPHIN)  IV     PRN Meds:.acetaminophen **OR** acetaminophen, calcium carbonate, ondansetron **OR** ondansetron (ZOFRAN) IV  Antibiotics  :   Anti-infectives (From admission, onward)   Start     Dose/Rate Route Frequency Ordered Stop   05/14/18 0600  cefTRIAXone (ROCEPHIN) 1 g in sodium chloride 0.9 % 100 mL IVPB     1 g 200 mL/hr over 30 Minutes Intravenous Every 24 hours 05/13/18 2236 05/21/18 0559   05/13/18  2245  cefTRIAXone (ROCEPHIN) 1 g in sodium chloride 0.9 % 100 mL IVPB  Status:  Discontinued     1 g 200 mL/hr over 30 Minutes Intravenous Every 24 hours 05/13/18 2236 05/13/18 2236   05/13/18 2245  azithromycin (ZITHROMAX) 500 mg in sodium chloride 0.9 % 250 mL IVPB     500 mg 250 mL/hr over 60 Minutes Intravenous Every 24 hours 05/13/18 2236 05/20/18 2244   05/13/18 2000  vancomycin (VANCOCIN) IVPB 1000 mg/200 mL premix     1,000 mg 200 mL/hr over 60 Minutes Intravenous  Once 05/13/18 1946 05/13/18 2318   05/13/18 1945  ceFEPIme (MAXIPIME) 1 g in sodium chloride 0.9 % 100 mL IVPB  Status:  Discontinued     1 g 200 mL/hr over 30 Minutes Intravenous Every 8 hours 05/13/18 1913 05/13/18 2239          Objective:   Vitals:   05/14/18 1000 05/14/18 1050 05/14/18 1057 05/14/18 1100  BP: (!) 121/91 116/69  115/64  Pulse: 88 80  76  Resp: 16 12  14   Temp:   98.7 F (37.1 C)   TempSrc:   Axillary   SpO2: 95% 94%  93%  Weight:      Height:        Wt Readings from Last 3 Encounters:  05/13/18 78 kg  10/10/16 76.7 kg  06/29/16 72.1 kg     Intake/Output Summary (Last 24 hours) at 05/14/2018 1243 Last data filed at 05/14/2018 0900 Gross per 24  hour  Intake 240 ml  Output -  Net 240 ml     Physical Exam  Awake  , No new F.N deficits, Normal affect [place].AT,PERRAL Supple Neck,No JVD, No cervical lymphadenopathy appriciated.  Symmetrical Chest wall movement, Good air movement bilaterally, CTAB RRR,No Gallops,Rubs or new Murmurs, No Parasternal Heave +ve B.Sounds, Abd Soft, No tenderness, No organomegaly appriciated, No rebound - guarding or rigidity. No Cyanosis, Clubbing or edema, No new Rash or bruise       Data Review:    CBC Recent Labs  Lab 05/13/18 1742 05/14/18 0339  WBC 12.8* 9.4  HGB 12.5* 11.9*  HCT 39.0 38.4*  PLT 207 200  MCV 95.6 96.7  MCH 30.6 30.0  MCHC 32.1 31.0  RDW 12.5 12.5  LYMPHSABS 0.5*  --   MONOABS 1.5*  --   EOSABS 0.0  --   BASOSABS 0.0   --     Chemistries  Recent Labs  Lab 05/13/18 1742 05/14/18 0338 05/14/18 0339  NA 135  --  138  K 4.1  --  4.6  CL 101  --  105  CO2 23  --  27  GLUCOSE 300*  --  245*  BUN 16  --  16  CREATININE 1.21  --  1.17  CALCIUM 9.1  --  9.1  MG 1.6* 1.7  --   AST 20  --   --   ALT 18  --   --   ALKPHOS 40  --   --   BILITOT 1.1  --   --    ------------------------------------------------------------------------------------------------------------------ No results for input(s): CHOL, HDL, LDLCALC, TRIG, CHOLHDL, LDLDIRECT in the last 72 hours.  Lab Results  Component Value Date   HGBA1C 8.1 (H) 04/14/2016   ------------------------------------------------------------------------------------------------------------------ Recent Labs    05/14/18 0339  TSH 0.744   ------------------------------------------------------------------------------------------------------------------ No results for input(s): VITAMINB12, FOLATE, FERRITIN, TIBC, IRON, RETICCTPCT in the last 72 hours.  Coagulation profile Recent Labs  Lab 05/13/18 1742  INR 1.02    No results for input(s): DDIMER in the last 72 hours.  Cardiac Enzymes Recent Labs  Lab 05/13/18 1742 05/14/18 0338 05/14/18 1055  TROPONINI <0.03 0.03* 0.03*   ------------------------------------------------------------------------------------------------------------------    Component Value Date/Time   BNP 128.1 (H) 05/13/2018 1742    Micro Results Recent Results (from the past 240 hour(s))  Culture, blood (routine x 2) Call MD if unable to obtain prior to antibiotics being given     Status: None (Preliminary result)   Collection Time: 05/13/18  8:06 PM  Result Value Ref Range Status   Specimen Description BLOOD RIGHT ANTECUBITAL  Final   Special Requests   Final    BOTTLES DRAWN AEROBIC AND ANAEROBIC Blood Culture adequate volume   Culture   Final    NO GROWTH < 12 HOURS Performed at [facility], [address], [place], [place] [address]    Report Status PENDING  Incomplete  Culture, blood (routine x 2) Call MD if unable to obtain prior to antibiotics being given     Status: None (Preliminary result)   Collection Time: 05/13/18  8:09 PM  Result Value Ref Range Status   Specimen Description BLOOD RT UPPER ARM  Final   Special Requests   Final    BOTTLES DRAWN AEROBIC AND ANAEROBIC Blood Culture adequate volume   Culture   Final    NO GROWTH < 12 HOURS Performed at [facility], [address], [place], [place]  [address]    Report Status PENDING  Incomplete  MRSA PCR Screening     Status: None   Collection Time: 05/14/18 10:43 AM  Result Value Ref Range Status   MRSA by PCR NEGATIVE NEGATIVE Final    Comment:        The GeneXpert MRSA Assay (FDA approved for NASAL specimens only), is one component of a comprehensive MRSA colonization surveillance program. It is not intended to diagnose MRSA infection nor to guide or monitor treatment for MRSA infections. Performed at [facility], [place] [address] [place], [place] [address]     Radiology Reports Dg Chest Portable 1 View  Result Date: 05/13/2018 CLINICAL DATA:  Chest pain EXAM: PORTABLE CHEST 1 VIEW COMPARISON:  December 17, 2015 FINDINGS: There is patchy infiltrate in the left base. There is mild atelectatic change in the right base. Lungs elsewhere clear. Heart size and pulmonary vascularity are normal. No adenopathy. There is aortic atherosclerosis. No bone lesions. No pneumothorax. IMPRESSION: Patchy infiltrate consistent with pneumonia left base. Mild right base atelectasis. Heart size normal. There is aortic atherosclerosis. Aortic Atherosclerosis (ICD10-I70.0). Electronically Signed   By: Lowella Grip III M.D.   On: 05/13/2018 18:00    Time Spent in minutes  30   Lala Lund M.D on 05/14/2018 at 12:43 PM  To page go to [email or web address] - password [facility]

## 2018-05-15 ENCOUNTER — Other Ambulatory Visit: Payer: Self-pay

## 2018-05-15 ENCOUNTER — Encounter (HOSPITAL_COMMUNITY): Payer: Self-pay | Admitting: General Practice

## 2018-05-15 ENCOUNTER — Inpatient Hospital Stay (HOSPITAL_COMMUNITY): Payer: Medicare Other

## 2018-05-15 DIAGNOSIS — R0789 Other chest pain: Secondary | ICD-10-CM

## 2018-05-15 DIAGNOSIS — E785 Hyperlipidemia, unspecified: Secondary | ICD-10-CM

## 2018-05-15 LAB — CBC
HEMATOCRIT: 36 % — AB (ref 39.0–52.0)
Hemoglobin: 12.2 g/dL — ABNORMAL LOW (ref 13.0–17.0)
MCH: 31.6 pg (ref 26.0–34.0)
MCHC: 33.9 g/dL (ref 30.0–36.0)
MCV: 93.3 fL (ref 80.0–100.0)
Platelets: 212 10*3/uL (ref 150–400)
RBC: 3.86 MIL/uL — ABNORMAL LOW (ref 4.22–5.81)
RDW: 12.4 % (ref 11.5–15.5)
WBC: 8.9 10*3/uL (ref 4.0–10.5)
nRBC: 0 % (ref 0.0–0.2)

## 2018-05-15 LAB — BASIC METABOLIC PANEL
Anion gap: 11 (ref 5–15)
BUN: 13 mg/dL (ref 8–23)
CO2: 22 mmol/L (ref 22–32)
Calcium: 8.6 mg/dL — ABNORMAL LOW (ref 8.9–10.3)
Chloride: 100 mmol/L (ref 98–111)
Creatinine, Ser: 0.98 mg/dL (ref 0.61–1.24)
GFR calc non Af Amer: >60 mL/min (ref >60)
Glucose, Bld: 225 mg/dL — ABNORMAL HIGH (ref 70–99)
Potassium: 4 mmol/L (ref 3.5–5.1)
Sodium: 133 mmol/L — ABNORMAL LOW (ref 135–145)

## 2018-05-15 LAB — GLUCOSE, CAPILLARY
GLUCOSE-CAPILLARY: 206 mg/dL — AB (ref 70–99)
Glucose-Capillary: 215 mg/dL — ABNORMAL HIGH (ref 70–99)
Glucose-Capillary: 288 mg/dL — ABNORMAL HIGH (ref 70–99)

## 2018-05-15 LAB — MAGNESIUM: Magnesium: 1.6 mg/dL — ABNORMAL LOW (ref 1.7–2.4)

## 2018-05-15 MED ORDER — MAGNESIUM SULFATE 2 GM/50ML IV SOLN
2.0000 g | Freq: Once | INTRAVENOUS | Status: AC
  Administered 2018-05-15: 2 g via INTRAVENOUS
  Filled 2018-05-15: qty 50

## 2018-05-15 MED ORDER — ALUM & MAG HYDROXIDE-SIMETH 200-200-20 MG/5ML PO SUSP: 30.0000 mL | Freq: Four times a day (QID) | ORAL | Status: DC | PRN

## 2018-05-15 NOTE — Care Management Note (Signed)
Case Management Note Manya Silvas, RN MSN CCM Transitions of Care [address] [place] [phone]  Patient Details  Name: [name] MRN: [id number] Date of Birth: Dec 22, 1929  Subjective/Objective:        CAP            Action/Plan: PTA lived at [facility]. CSW consult for anticipated return when medically stable. Still on IV antibiotics. DNR. Family not wanting aggressive workups. Dementia. Son is POA. Will continue to follow for transition of care needs.   Expected Discharge Date:                  Expected Discharge Plan:  Skilled Nursing Facility  In-House Referral:  Clinical Social Work  Discharge planning Services  CM Consult  Post Acute Care Choice:  NA Choice offered to:  NA  DME Arranged:  N/A DME Agency:  NA  HH Arranged:  NA HH Agency:  NA  Status of Service:  In process, will continue to follow  If discussed at Long Length of Stay Meetings, dates discussed:    Additional Comments:  Howard Crews, RN 05/15/2018, 11:55 AM

## 2018-05-15 NOTE — Progress Notes (Signed)
Called report to [address] [place] spoke to [employer] - pt being transferred per bed with belongings - family here aware of transfer Physical therapy here interviewing family about pt's mobility

## 2018-05-15 NOTE — Progress Notes (Signed)
Bathing pt with help from his son calming him down. Family wants pt to wear briefs that they bring in - advised family that as a hospital we do not condone use of briefs ( depends) pt has a small bit of redness inside Rt leg/perineum area applied cream after bathing  Pt trying to hit staff and yelling " you bitches are hurting me " Son and staff explaining to pt in a calm manner that we have to keep his skin clean - bathed patient as gently and quick as possible - changed sheets applied 2 warm blankets and pt resting quietly soon after we were done

## 2018-05-15 NOTE — Progress Notes (Addendum)
Pt inc of urine - pericare done noted some redness Rt groin/perineum Briefs off and placed condom catheter external catheter. Daughter not happy about this but explained about MASD and need of air circulating in this area. Daughter is worried about pt pulling condom cath  off so placed inc pads over pt's groin under gown. Pt transferred to [address] bed 10 per RN and NT

## 2018-05-15 NOTE — Progress Notes (Signed)
PROGRESS NOTE                                                                                                                                                                                                             Patient Demographics:    [name], is a 85 y.o. male, DOB - 1929/08/06, GXQ:[id number]  Admit date - 05/13/2018   Admitting Physician Rise Patience, MD  Outpatient Primary MD for the patient is Javier Glazier, MD  LOS - 2  Chief Complaint  Patient presents with  . Chest Pain       Brief Narrative  [name] is a 85 y.o. male with history of dementia, diabetes mellitus type 2, previous TIA, bladder cancer, hypertension was brought to the ER after patient complained of some abdominal and chest discomfort this afternoon. Patient stated to his son that he had some similar complaints also yesterday. Did not have any nausea vomiting shortness of breath productive cough or any diarrhea.  In the ER  there was question of STEMI.  Cardiology was consulted.  Family chose minimal conservative treatment.  Clinically STEMI has been ruled out.    Subjective:   Patient in bed, appears comfortable, denies any headache, no fever, no chest pain or pressure, no shortness of breath , no abdominal pain. No focal weakness.    Assessment  & Plan :      1.CAP - left-sided, parents with his atypical left-sided chest pain.  Agree with empiric IV Rocephin and azithromycin, continue supportive care and follow cultures.  2.  Questionable STEMI.  Clinically ruled out.  Troponin trend not consistent with true STEMI.  Continue Plavix, statin and beta-blocker for secondary mention, seen by cardiology, chest pain-free, echocardiogram to evaluate EF and wall motion is pending but ordered.  Do not think this was a true STEMI.  Family wishes gentle medical care only.  We will transfer him to medical bed.  Advance activity and PT evaluation, if better discharge tomorrow.  3.   Dyslipidemia.  On statin.  4.  Dementia.  On Aricept, at risk for delirium, minimize narcotic and benzodiazepine use.  5.  History of TIA.  On Plavix and statin.  No acute issue.  6.  Anemia of chronic disease.  Stable.  7.  Hypertension.  Stable on beta-blocker, Lasix and ARB.  8.  Diastolic dysfunction with EF of 60% on last echocardiogram in 2017 December.  Stable.  9.  Hypomagnesemia.  Replaced in stable  10.  CKD 3.  Stable.  11.  DM type II.  On Lantus and sliding scale.  CBG (last 3)  Recent Labs    05/14/18 2232 05/15/18 0815 05/15/18 1137  GLUCAP 254* 215* 44*       Family Communication  :  son and daughter bedside  Code Status :  DNR  Disposition Plan  :  TBD  Consults  :  Cards  Procedures  :    TTE   DVT Prophylaxis  :  Lovenox    Lab Results  Component Value Date   PLT 212 05/15/2018    Diet :  Diet Order            Diet heart healthy/carb modified Room service appropriate? Yes; Fluid consistency: Thin; Fluid restriction: 1200 mL Fluid  Diet effective now               Inpatient Medications Scheduled Meds: . aspirin  324 mg Oral Once  . atorvastatin  10 mg Oral Daily  . citalopram  20 mg Oral QHS  . clopidogrel  75 mg Oral Daily  . enoxaparin (LOVENOX) injection  40 mg Subcutaneous Q24H  . furosemide  20 mg Oral Daily  . gabapentin  100 mg Oral BID  . insulin aspart  0-9 Units Subcutaneous TID WC  . insulin glargine  22 Units Subcutaneous Daily  . losartan  25 mg Oral Daily  . metoprolol tartrate  12.5 mg Oral BID  . traMADol  50 mg Oral Daily  . vitamin B-12  2,000 mcg Oral Daily   Continuous Infusions: . azithromycin Stopped (05/14/18 2256)  . cefTRIAXone (ROCEPHIN)  IV 1 g (05/15/18 0552)   PRN Meds:.acetaminophen **OR** acetaminophen, calcium carbonate, ondansetron **OR** ondansetron (ZOFRAN) IV  Antibiotics  :   Anti-infectives (From admission, onward)   Start     Dose/Rate Route Frequency Ordered Stop   05/14/18  0600  cefTRIAXone (ROCEPHIN) 1 g in sodium chloride 0.9 % 100 mL IVPB     1 g 200 mL/hr over 30 Minutes Intravenous Every 24 hours 05/13/18 2236 05/21/18 0559   05/13/18 2245  cefTRIAXone (ROCEPHIN) 1 g in sodium chloride 0.9 % 100 mL IVPB  Status:  Discontinued     1 g 200 mL/hr over 30 Minutes Intravenous Every 24 hours 05/13/18 2236 05/13/18 2236   05/13/18 2245  azithromycin (ZITHROMAX) 500 mg in sodium chloride 0.9 % 250 mL IVPB     500 mg 250 mL/hr over 60 Minutes Intravenous Every 24 hours 05/13/18 2236 05/20/18 2244   05/13/18 2000  vancomycin (VANCOCIN) IVPB 1000 mg/200 mL premix     1,000 mg 200 mL/hr over 60 Minutes Intravenous  Once 05/13/18 1946 05/13/18 2318   05/13/18 1945  ceFEPIme (MAXIPIME) 1 g in sodium chloride 0.9 % 100 mL IVPB  Status:  Discontinued     1 g 200 mL/hr over 30 Minutes Intravenous Every 8 hours 05/13/18 1913 05/13/18 2239          Objective:   Vitals:   05/15/18 0637 05/15/18 0800 05/15/18 0816 05/15/18 1103  BP: (!) 151/82 (!) 152/74    Pulse: 93 82    Resp: (!) 26 (!) 22    Temp: 98.5 F (36.9 C)  98.3 F (36.8 C) 99.4 F (37.4 C)  TempSrc: Oral  Oral Axillary  SpO2: 91% 92%    Weight:      Height:        Wt Readings from Last 3 Encounters:  05/15/18 78.7 kg  10/10/16 76.7 kg  06/29/16 72.1 kg     Intake/Output Summary (Last 24 hours) at 05/15/2018 1305 Last data filed at 05/15/2018 1215 Gross per 24 hour  Intake 1770 ml  Output -  Net 1770 ml     Physical Exam  Awake pleasantly confused, No new F.N deficits, Normal affect [place].AT,PERRAL Supple Neck,No JVD, No cervical lymphadenopathy appriciated.  Symmetrical Chest wall movement, Good air movement bilaterally, CTAB RRR,No Gallops, Rubs or new Murmurs, No Parasternal Heave +ve B.Sounds, Abd Soft, No tenderness, No organomegaly appriciated, No rebound - guarding or rigidity. No Cyanosis, Clubbing or edema, No new Rash or bruise     Data Review:    CBC Recent Labs    Lab 05/13/18 1742 05/14/18 0339 05/15/18 0648  WBC 12.8* 9.4 8.9  HGB 12.5* 11.9* 12.2*  HCT 39.0 38.4* 36.0*  PLT 207 200 212  MCV 95.6 96.7 93.3  MCH 30.6 30.0 31.6  MCHC 32.1 31.0 33.9  RDW 12.5 12.5 12.4  LYMPHSABS 0.5*  --   --   MONOABS 1.5*  --   --   EOSABS 0.0  --   --   BASOSABS 0.0  --   --     Chemistries  Recent Labs  Lab 05/13/18 1742 05/14/18 0338 05/14/18 0339 05/15/18 0648  NA 135  --  138 133*  K 4.1  --  4.6 4.0  CL 101  --  105 100  CO2 23  --  27 22  GLUCOSE 300*  --  245* 225*  BUN 16  --  16 13  CREATININE 1.21  --  1.17 0.98  CALCIUM 9.1  --  9.1 8.6*  MG 1.6* 1.7  --  1.6*  AST 20  --   --   --   ALT 18  --   --   --   ALKPHOS 40  --   --   --   BILITOT 1.1  --   --   --    ------------------------------------------------------------------------------------------------------------------ No results for input(s): CHOL, HDL, LDLCALC, TRIG, CHOLHDL, LDLDIRECT in the last 72 hours.  Lab Results  Component Value Date   HGBA1C 8.1 (H) 04/14/2016   ------------------------------------------------------------------------------------------------------------------ Recent Labs    05/14/18 0339  TSH 0.744   ------------------------------------------------------------------------------------------------------------------ No results for input(s): VITAMINB12, FOLATE, FERRITIN, TIBC, IRON, RETICCTPCT in the last 72 hours.  Coagulation profile Recent Labs  Lab 05/13/18 1742  INR 1.02    No results for input(s): DDIMER in the last 72 hours.  Cardiac Enzymes Recent Labs  Lab 05/13/18 1742 05/14/18 0338 05/14/18 1055  TROPONINI <0.03 0.03* 0.03*   ------------------------------------------------------------------------------------------------------------------    Component Value Date/Time   BNP 128.1 (H) 05/13/2018 1742    Micro Results Recent Results (from the past 240 hour(s))  Culture, blood (routine x 2) Call MD if unable to  obtain prior to antibiotics being given     Status: None (Preliminary result)   Collection Time: 05/13/18  8:06 PM  Result Value Ref Range Status   Specimen Description BLOOD RIGHT ANTECUBITAL  Final   Special Requests   Final    BOTTLES DRAWN AEROBIC AND ANAEROBIC Blood Culture adequate volume   Culture   Final    NO GROWTH 2 DAYS Performed at [facility], [place] [address] [place], [place] [address]    Report Status PENDING  Incomplete  Culture, blood (routine x 2) Call MD if unable to obtain prior to antibiotics being given  Status: None (Preliminary result)   Collection Time: 05/13/18  8:09 PM  Result Value Ref Range Status   Specimen Description BLOOD RIGHT ARM UPPER  Final   Special Requests   Final    BOTTLES DRAWN AEROBIC AND ANAEROBIC Blood Culture adequate volume   Culture   Final    NO GROWTH 2 DAYS Performed at [facility], [address], [place], [place] [address]    Report Status PENDING  Incomplete  MRSA PCR Screening     Status: None   Collection Time: 05/14/18 10:43 AM  Result Value Ref Range Status   MRSA by PCR NEGATIVE NEGATIVE Final    Comment:        The GeneXpert MRSA Assay (FDA approved for NASAL specimens only), is one component of a comprehensive MRSA colonization surveillance program. It is not intended to diagnose MRSA infection nor to guide or monitor treatment for MRSA infections. Performed at [facility], [place] [address] [place], [place] [address]     Radiology Reports Dg Chest Portable 1 View  Result Date: 05/13/2018 CLINICAL DATA:  Chest pain EXAM: PORTABLE CHEST 1 VIEW COMPARISON:  December 17, 2015 FINDINGS: There is patchy infiltrate in the left base. There is mild atelectatic change in the right base. Lungs elsewhere clear. Heart size and pulmonary vascularity are normal. No adenopathy. There is aortic atherosclerosis. No bone lesions. No pneumothorax. IMPRESSION: Patchy infiltrate consistent with pneumonia left base.  Mild right base atelectasis. Heart size normal. There is aortic atherosclerosis. Aortic Atherosclerosis (ICD10-I70.0). Electronically Signed   By: Lowella Grip III M.D.   On: 05/13/2018 18:00    Time Spent in minutes  30   Lala Lund M.D on 05/15/2018 at 1:05 PM  To page go to [email or web address] - password [facility]

## 2018-05-15 NOTE — Progress Notes (Signed)
Evening medications - celexa, metoprolol, and neurontin held due to pt refusal.  Son at bedside and RN tried multiple times with gentle explanation, water and applesauce offered.  Pt refused each time.  MD on-call notified.

## 2018-05-15 NOTE — Progress Notes (Signed)
Echocardiogram attempted, however patient's family refused. Order will be canceled, please reorder if necessary.  05/15/2018 2:49 PM Maudry Mayhew, MHA, RVT, RDCS, RDMS

## 2018-05-15 NOTE — Evaluation (Signed)
Physical Therapy Evaluation Patient Details Name: [name] MRN: [id number] DOB: 06-24-1929 Today's Date: 05/15/2018   History of Present Illness  [name] is a 85 y.o. male with history of dementia, diabetes mellitus type 2, previous TIA, bladder cancer, hypertension was brought to the ER after patient complained of some abdominal and chest discomfort this afternoon    Clinical Impression  Pt admitted with above diagnosis. Pt currently with functional limitations due to the deficits listed below (see PT Problem List). History obtained by daughter bedside. PTA, patient living at SNF short distance ambulation with RW, w/c for longer distances, assistance with ADLs, conversational and pleasantly confused at baseline and working with PT 2x a week at facility. Today patient agitated and just asleep at PT entry, became agitated with bed transfer to next unit. Family present and exhausted, educated on mitigating risk of delirium and discussed role of PT ongoing. Will progress OOB mobility next visit.  Pt will benefit from skilled PT to increase their independence and safety with mobility to allow discharge to the venue listed below.       Follow Up Recommendations SNF    Equipment Recommendations    none   Recommendations for Other Services       Precautions / Restrictions Precautions Precautions: Fall      Mobility  Bed Mobility Overal bed mobility: Needs Assistance Bed Mobility: Supine to Sit     Supine to sit: Max assist        Transfers                    Ambulation/Gait                Stairs            Wheelchair Mobility    Modified Rankin (Stroke Patients Only)       Balance                                             Pertinent Vitals/Pain Pain Assessment: (agitation )    Home Living Family/patient expects to be discharged to:: Skilled nursing facility                      Prior Function Level of  Independence: Needs assistance   Gait / Transfers Assistance Needed: short distance with RW  ADL's / Homemaking Assistance Needed: assist from facility         Hand Dominance        Extremity/Trunk Assessment   Upper Extremity Assessment Upper Extremity Assessment: Difficult to assess due to impaired cognition    Lower Extremity Assessment Lower Extremity Assessment: Difficult to assess due to impaired cognition       Communication   Communication: Other (comment)(lethargic during visit)  Cognition Arousal/Alertness: Lethargic Behavior During Therapy: Agitated;Restless Overall Cognitive Status: History of cognitive impairments - at baseline                                 General Comments: pt conversational at baseline with hx of dementia per family, has not slept in 2 days and has been agitated      General Comments General comments (skin integrity, edema, etc.): Discussed with family over reducing risk of delerium     Exercises  Assessment/Plan    PT Assessment Patient needs continued PT services  PT Problem List Decreased cognition;Decreased strength       PT Treatment Interventions DME instruction;Gait training;Stair training;Functional mobility training;Therapeutic activities;Therapeutic exercise;Balance training    PT Goals (Current goals can be found in the Care Plan section)  Acute Rehab PT Goals Patient Stated Goal: non stated families wish to return to SNF PT Goal Formulation: With patient/family Time For Goal Achievement: 05/29/18 Potential to Achieve Goals: Fair    Frequency Min 2X/week   Barriers to discharge        Co-evaluation               AM-PAC PT "6 Clicks" Mobility  Outcome Measure Help needed turning from your back to your side while in a flat bed without using bedrails?: Total Help needed moving from lying on your back to sitting on the side of a flat bed without using bedrails?: Total Help needed moving  to and from a bed to a chair (including a wheelchair)?: Total Help needed standing up from a chair using your arms (e.g., wheelchair or bedside chair)?: Total Help needed to walk in hospital room?: Total Help needed climbing 3-5 steps with a railing? : Total 6 Click Score: 6    End of Session   Activity Tolerance: Treatment limited secondary to agitation Patient left: in bed;with call bell/phone within reach;with nursing/sitter in room;with family/visitor present Nurse Communication: Mobility status PT Visit Diagnosis: Unsteadiness on feet (R26.81)    Time: 1730-1800 PT Time Calculation (min) (ACUTE ONLY): 30 min   Charges:   PT Evaluation $PT Eval Moderate Complexity: 1 Mod          Reinaldo Berber, PT, DPT Acute Rehabilitation Services Pager: [phone] Office: [phone]    Reinaldo Berber 05/15/2018, 6:18 PM

## 2018-05-16 LAB — GLUCOSE, CAPILLARY: Glucose-Capillary: 208 mg/dL — ABNORMAL HIGH (ref 70–99)

## 2018-05-16 MED ORDER — PANTOPRAZOLE SODIUM 40 MG PO TBEC: 40.0000 mg | DELAYED_RELEASE_TABLET | Freq: Two times a day (BID) | ORAL | Status: AC

## 2018-05-16 MED ORDER — CEPHALEXIN 500 MG PO CAPS: 500.0000 mg | ORAL_CAPSULE | Freq: Three times a day (TID) | ORAL | 0 refills | Status: AC

## 2018-05-16 MED ORDER — LORAZEPAM 2 MG/ML PO CONC: 1.0000 mg | Freq: Four times a day (QID) | ORAL | 0 refills | Status: AC | PRN

## 2018-05-16 MED ORDER — MORPHINE SULFATE (PF) 2 MG/ML IV SOLN
1.0000 mg | INTRAVENOUS | Status: DC | PRN
  Administered 2018-05-16: 1 mg via INTRAVENOUS
  Filled 2018-05-16: qty 1

## 2018-05-16 MED ORDER — MORPHINE SULFATE (CONCENTRATE) 10 MG/0.5ML PO SOLN: 10.0000 mg | ORAL | 0 refills | Status: AC | PRN

## 2018-05-16 MED ORDER — AZITHROMYCIN 500 MG PO TABS: 500.0000 mg | ORAL_TABLET | Freq: Every day | ORAL | 0 refills | Status: AC

## 2018-05-16 MED ORDER — ALUM & MAG HYDROXIDE-SIMETH 200-200-20 MG/5ML PO SUSP: 30.0000 mL | Freq: Four times a day (QID) | ORAL | 0 refills | Status: AC | PRN

## 2018-05-16 NOTE — Clinical Social Work Note (Signed)
CSW called pt's daughter to confirm d/c plan for pt to return back to [facility] however had to leave a voicemail. Then pt's daughter called CSW back during progression and left a voicemail. CSW returned pt's daughter's call and had to leave another voicemail--awaiting call back. Pt is d/c.  Loletha Grayer, [place]

## 2018-05-16 NOTE — Clinical Social Work Note (Signed)
Clinical Social Work Assessment  Patient Details  Name: [name] MRN: [id number] Date of Birth: 11/09/1929  Date of referral:  05/16/18               Reason for consult:  Facility Placement                Permission sought to share information with:  Facility Art therapist granted to share information::     Name::     [place] Section::  [facility]  Relationship::  daughter  Contact Information:     Housing/Transportation Living arrangements for the past 2 months:  [place] of Information:  Adult Children Patient Interpreter Needed:  None Criminal Activity/Legal Involvement Pertinent to Current Situation/Hospitalization:  No - Comment as needed Significant Relationships:  Adult Children Lives with:  Facility Resident Do you feel safe going back to the place where you live?  Yes Need for family participation in patient care:  No (Coment)  Care giving concerns:  Pt only alert to self. Pt's daughter present at bedside.   Social Worker assessment / plan:  Pt's daughter confirmed pt is LTC at [facility] and will return at d/c. CSW followed up with facility and facility prepared to take pt back today. All documents sent to Audry at [facility] and she is ready for pt.   Employment status:  Retired Nurse, adult PT Recommendations:  [place] / Referral to community resources:  [place]  Patient/Family's Response to care:  Pt's daughter verbalized understanding of CSW role and expressed appreciation for support. Pt's daughter denies any concern regarding pt care at this time.   Patient/Family's Understanding of and Emotional Response to Diagnosis, Current Treatment, and Prognosis:  Pt's daughter understanding of pt's physical limitations. Pt's daughter agreeable for pt to return to SNF.   Emotional Assessment Appearance:  Appears stated  age Attitude/Demeanor/Rapport:  Unable to Assess Affect (typically observed):  Unable to Assess Orientation:  Oriented to Self Alcohol / Substance use:  Not Applicable Psych involvement (Current and /or in the community):  No (Comment)  Discharge Needs  Concerns to be addressed:  Care Coordination, Basic Needs Readmission within the last 30 days:  No Current discharge risk:  None Barriers to Discharge:  No Barriers Identified   Jermanie Minshall A Rajni Holsworth, LCSW 05/16/2018, 1:14 PM

## 2018-05-16 NOTE — NC FL2 (Signed)
[place] MEDICAID FL2 LEVEL OF CARE SCREENING TOOL     IDENTIFICATION  Patient Name: [name] Birthdate: February 20, 1930 Sex: male Admission Date (Current Location): 05/13/2018  [facility] and [place] Number:  Herbalist and Address:  [facility], [place] [address] [place], [place] [address]      Provider Number: [id number]  Attending Physician Name and Address:  Thurnell Lose, MD  Relative Name and Phone Number:       Current Level of Care: Hospital Recommended Level of Care: [place] Prior Approval Number:    Date Approved/Denied:   PASRR Number:    Discharge Plan: SNF    Current Diagnoses: Patient Active Problem List   Diagnosis Date Noted  . CAP (community acquired pneumonia) 05/13/2018  . Uncontrolled type 2 diabetes mellitus with hyperglycemia ([place]) 05/13/2018  . Chest pain 05/13/2018  . HTN (hypertension) 06/12/2016  . HLD (hyperlipidemia) 06/12/2016  . Abnormality of gait 06/12/2016  . Aphasia   . TIA (transient ischemic attack) 04/14/2016    Orientation RESPIRATION BLADDER Height & Weight     Self  Normal Incontinent Weight: 173 lb 8 oz (78.7 kg) Height:  5\' 8"  (172.7 cm)  BEHAVIORAL SYMPTOMS/MOOD NEUROLOGICAL BOWEL NUTRITION STATUS      Incontinent Diet(hearth healthy/carb modified, thin liquids, liquid restriction 1200 mL)  AMBULATORY STATUS COMMUNICATION OF NEEDS Skin   Extensive Assist Verbally Normal                       Personal Care Assistance Level of Assistance  Bathing, Feeding, Dressing Bathing Assistance: Maximum assistance Feeding assistance: Maximum assistance Dressing Assistance: Maximum assistance     Functional Limitations Info  Sight, Hearing, Speech Sight Info: Adequate Hearing Info: Adequate Speech Info: Adequate    SPECIAL CARE FACTORS FREQUENCY  PT (By licensed PT), OT (By licensed OT)     PT Frequency: 2x OT Frequency: 2x            Contractures  Contractures Info: Not present    Additional Factors Info  Code Status, Allergies Code Status Info: DNR Allergies Info: Actos Pioglitazone           Current Medications (05/16/2018):  This is the current hospital active medication list Current Facility-Administered Medications  Medication Dose Route Frequency Provider Last Rate Last Dose  . acetaminophen (TYLENOL) tablet 650 mg  650 mg Oral Q6H PRN Thurnell Lose, MD   650 mg at 05/14/18 2725   Or  . acetaminophen (TYLENOL) suppository 650 mg  650 mg Rectal Q6H PRN Thurnell Lose, MD      . alum & mag hydroxide-simeth (MAALOX/MYLANTA) 200-200-20 MG/5ML suspension 30 mL  30 mL Oral Q6H PRN Thurnell Lose, MD      . aspirin chewable tablet 324 mg  324 mg Oral Once Thurnell Lose, MD      . atorvastatin (LIPITOR) tablet 10 mg  10 mg Oral Daily Thurnell Lose, MD   10 mg at 05/16/18 0941  . azithromycin (ZITHROMAX) 500 mg in sodium chloride 0.9 % 250 mL IVPB  500 mg Intravenous Q24H Thurnell Lose, MD 250 mL/hr at 05/15/18 2106 500 mg at 05/15/18 2106  . calcium carbonate (TUMS - dosed in mg elemental calcium) chewable tablet 400 mg of elemental calcium  2 tablet Oral QID PRN Thurnell Lose, MD   400 mg of elemental calcium at 05/15/18 0846  . cefTRIAXone (ROCEPHIN) 1 g in  sodium chloride 0.9 % 100 mL IVPB  1 g Intravenous Q24H Thurnell Lose, MD 200 mL/hr at 05/16/18 0520 1 g at 05/16/18 0520  . citalopram (CELEXA) tablet 20 mg  20 mg Oral QHS Thurnell Lose, MD   20 mg at 05/14/18 2117  . clopidogrel (PLAVIX) tablet 75 mg  75 mg Oral Daily Thurnell Lose, MD   75 mg at 05/16/18 0942  . enoxaparin (LOVENOX) injection 40 mg  40 mg Subcutaneous Q24H Thurnell Lose, MD   40 mg at 05/16/18 0942  . furosemide (LASIX) tablet 20 mg  20 mg Oral Daily Thurnell Lose, MD   20 mg at 05/16/18 0941  . gabapentin (NEURONTIN) capsule 100 mg  100 mg Oral BID Thurnell Lose, MD   100 mg at 05/16/18 0941  . insulin aspart  (novoLOG) injection 0-9 Units  0-9 Units Subcutaneous TID WC Thurnell Lose, MD   3 Units at 05/16/18 [phone]  . insulin glargine (LANTUS) injection 22 Units  22 Units Subcutaneous Daily Thurnell Lose, MD   22 Units at 05/16/18 [phone]  . losartan (COZAAR) tablet 25 mg  25 mg Oral Daily Thurnell Lose, MD   25 mg at 05/16/18 0942  . metoprolol tartrate (LOPRESSOR) tablet 12.5 mg  12.5 mg Oral BID Thurnell Lose, MD   12.5 mg at 05/16/18 0941  . ondansetron (ZOFRAN) tablet 4 mg  4 mg Oral Q6H PRN Thurnell Lose, MD       Or  . ondansetron [facility]) injection 4 mg  4 mg Intravenous Q6H PRN Thurnell Lose, MD      . traMADol Veat[facility]) tablet 50 mg  50 mg Oral Daily Thurnell Lose, MD   50 mg at 05/16/18 0942  . vitamin B-12 (CYANOCOBALAMIN) tablet 2,000 mcg  2,000 mcg Oral Daily Thurnell Lose, MD   2,000 mcg at 05/16/18 6659     Discharge Medications: Please see discharge summary for a list of discharge medications.  Relevant Imaging Results:  Relevant Lab Results:   Additional Information SSN: [id number]  Eileen Stanford, LCSW

## 2018-05-16 NOTE — Clinical Social Work Placement (Signed)
   CLINICAL SOCIAL WORK PLACEMENT  NOTE  Date:  05/16/2018  Patient Details  Name: [name] MRN: [id number] Date of Birth: [birth year]  Clinical Social Work is seeking post-discharge placement for this patient at the [place] level of care (*CSW will initial, date and re-position this form in  chart as items are completed):      Patient/family provided with [facility] Work Department's list of facilities offering this level of care within the geographic area requested by the patient (or if unable, by the patient's family).  Yes   Patient/family informed of their freedom to choose among providers that offer the needed level of care, that participate in Medicare, Medicaid or managed care program needed by the patient, have an available bed and are willing to accept the patient.      Patient/family informed of [facility]'s ownership interest in [facility] and [facility], as well as of the fact that they are under no obligation to receive care at these facilities.  PASRR submitted to EDS on       PASRR number received on       Existing PASRR number confirmed on 05/16/18     FL2 transmitted to all facilities in geographic area requested by pt/family on 05/16/18     FL2 transmitted to all facilities within larger geographic area on       Patient informed that his/her managed care company has contracts with or will negotiate with certain facilities, including the following:            Patient/family informed of bed offers received.  Patient chooses bed at [facility]     Physician recommends and patient chooses bed at      Patient to be transferred to [facility] [place] on 05/16/18.  Patient to be transferred to facility by Pt's daughter will transport pt back to SNF     Patient family notified on 05/16/18 of transfer.  Name of family member notified:  [name]     PHYSICIAN       Additional Comment:     _______________________________________________ Eileen Stanford, LCSW 05/16/2018, 1:16 PM

## 2018-05-16 NOTE — Progress Notes (Addendum)
Wasted 1mg  of morphine in stericycle with Marcelino Duster, RN as witness.  Pt  Was already discharged from unit and was unable to waste in the pyxis. Please see Kristina's note in pt chart

## 2018-05-16 NOTE — Progress Notes (Signed)
Verified Waste of morphine 1 mg with Kristine Hutchens in [employer].

## 2018-05-16 NOTE — Discharge Summary (Signed)
[name] SKA:[id number] DOB: 01/15/1930 DOA: 05/13/2018  PCP: Javier Glazier, MD  Admit date: 05/13/2018  Discharge date: 05/16/2018  Admitted From: SNF  Disposition:  SNF   Recommendations for Outpatient Follow-up:   Follow up with PCP in 1-2 weeks  PCP Please obtain BMP/CBC, 2 view CXR in 1week,  (see Discharge instructions)   PCP Please follow up on the following pending results: All of care is comfort if declines further family agreeable for full hospice   Home Health: NONE   Equipment/Devices:  None Consultations: None Discharge Condition: Guarded   CODE STATUS: DNR   Diet Recommendation: Heart Healthy Low Carb     Chief Complaint  Patient presents with  . Chest Pain     Brief history of present illness from the day of admission and additional interim summary    [name] is a 85 y.o. male with history of dementia, diabetes mellitus type 2, previous TIA, bladder cancer, hypertension was brought to the ER after patient complained of some abdominal and chest discomfort this afternoon. Patient stated to his son that he had some similar complaints also yesterday. Did not have any nausea vomiting shortness of breath productive cough or any diarrhea.  In the ER  there was question of STEMI.  Cardiology was consulted.  Family chose minimal conservative treatment.  Clinically STEMI has been ruled out.                                                                 Hospital Course   1.CAP - left-sided, parents with his atypical left-sided chest pain.    Influenza panel was negative, cultures negative so far he was started on IV Rocephin and azithromycin combination, clinically improved will be transition to 3 more days of oral antibiotics and discharge to SNF.    Note patient and family leaning more  towards comfort measures, he has been refusing blood draws and medications today, had detailed discussions with patient's son bedside today.  Plan of care is to discharge to SNF with focus being on comfort, if he declines further full hospice should be initiated, no invasive tests or heroics to be attempted.  2.  Questionable STEMI.  Clinically ruled out.  Troponin trend not consistent with true STEMI.  Continue Plavix, statin and beta-blocker for secondary mention, seen by cardiology, chest pain-free, echocardiogram to evaluate EF and wall motion is pending but ordered.  Do not think this was a true STEMI.  Family wishes gentle medical care only.     3.  Dyslipidemia.  On statin.  4.  Dementia.  On Aricept, at risk for delirium, minimize narcotic and benzodiazepine use.  5.  History of TIA.  On Plavix and statin.  No acute issue.  6.  Anemia of chronic disease.  Stable.  7.  Hypertension.  Stable on beta-blocker, Lasix and ARB.  8.  Diastolic dysfunction with EF of 60% on last echocardiogram in 2017 December.  Stable.  9.  Hypomagnesemia.  Replaced in stable  10.  CKD 3.  Stable.  11.  DM type II.  On Lantus and sliding scale. Monitor CBGs QA CHS     Discharge diagnosis     Principal Problem:   CAP (community acquired pneumonia) Active Problems:   HTN (hypertension)   HLD (hyperlipidemia)   Uncontrolled type 2 diabetes mellitus with hyperglycemia ([place])   Chest pain    Discharge instructions    Discharge Instructions    Diet - low sodium heart healthy   Complete by:  As directed    Discharge instructions   Complete by:  As directed    Follow with Primary MD Javier Glazier, MD in 7 days   Activity: As tolerated with Full fall precautions use walker/cane & assistance as needed  Disposition SNF with goal of care being comfort.  If declines full hospice.  Diet: Heart Healthy Low Carb , with feeding assistance and aspiration precautions. Check CBGs  QAC-HS  Special Instructions: If you have smoked or chewed Tobacco  in the last 2 yrs please stop smoking, stop any regular Alcohol  and or any Recreational drug use.  On your next visit with your primary care physician please Get Medicines reviewed and adjusted.  Please request your Prim.MD to go over all Hospital Tests and Procedure/Radiological results at the follow up, please get all Hospital records sent to your Prim MD by signing hospital release before you go home.  If you experience worsening of your admission symptoms, develop shortness of breath, life threatening emergency, suicidal or homicidal thoughts you must seek medical attention immediately by calling 911 or calling your MD immediately  if symptoms less severe.  You Must read complete instructions/literature along with all the possible adverse reactions/side effects for all the Medicines you take and that have been prescribed to you. Take any new Medicines after you have completely understood and accpet all the possible adverse reactions/side effects.   Increase activity slowly   Complete by:  As directed       Discharge Medications   Allergies as of 05/16/2018      Reactions   Actos [pioglitazone]    Rash      Medication List    STOP taking these medications   traMADol 50 MG tablet Commonly known as:  ULTRAM     TAKE these medications   acetaminophen 500 MG tablet Commonly known as:  TYLENOL Take 500-1,000 mg by mouth See admin instructions. Take 500 mg twice daily and 1000 mg every night at bedtime.   alum & mag hydroxide-simeth 200-200-20 MG/5ML suspension Commonly known as:  MAALOX/MYLANTA Take 30 mLs by mouth every 6 (six) hours as needed for indigestion or heartburn.   ammonium lactate 12 % cream Commonly known as:  AMLACTIN Apply 1 g topically 2 (two) times daily. Apply to lower extremities   atorvastatin 10 MG tablet Commonly known as:  LIPITOR Take 10 mg by mouth daily.   azithromycin 500 MG  tablet Commonly known as:  ZITHROMAX Take 1 tablet (500 mg total) by mouth daily. For 3 more days.   BAZA ANTIFUNGAL 2 % cream Generic drug:  miconazole Apply 1 application topically 3 (three) times daily.   cephALEXin 500 MG capsule Commonly known as:  KEFLEX Take 1 capsule (500 mg total) by  mouth 3 (three) times daily for 10 days. For 3 more days   citalopram 20 MG tablet Commonly known as:  CELEXA Take 20 mg by mouth at bedtime.   clopidogrel 75 MG tablet Commonly known as:  PLAVIX Take 1 tablet (75 mg total) by mouth daily.   furosemide 20 MG tablet Commonly known as:  LASIX Take 20 mg by mouth daily.   gabapentin 100 MG capsule Commonly known as:  NEURONTIN Take 100 mg by mouth 2 (two) times daily.   insulin aspart 100 UNIT/ML injection Commonly known as:  novoLOG Inject 14-17 Units into the skin See admin instructions. Inject 14-17 units subcutaneously four times daily per sliding scale: CBG 70-199 14 units, 200-299 15 units, 300-399 16 units, >400 17 units.   insulin glargine 100 UNIT/ML injection Commonly known as:  LANTUS Inject 22 Units into the skin daily.   LORazepam 2 MG/ML concentrated solution Commonly known as:  ATIVAN Take 0.5 mLs (1 mg total) by mouth every 6 (six) hours as needed for anxiety.   losartan 25 MG tablet Commonly known as:  COZAAR Take 25 mg by mouth daily.   morphine CONCENTRATE 10 MG/0.5ML Soln concentrated solution Take 0.5 mLs (10 mg total) by mouth every 3 (three) hours as needed for moderate pain or severe pain.   ondansetron 4 MG tablet Commonly known as:  ZOFRAN Take 1 tablet (4 mg total) by mouth every 6 (six) hours.   pantoprazole 40 MG tablet Commonly known as:  PROTONIX Take 1 tablet (40 mg total) by mouth 2 (two) times daily.   tamsulosin 0.4 MG Caps capsule Commonly known as:  FLOMAX Take 1 capsule (0.4 mg total) by mouth daily.   TUMS 500 MG chewable tablet Generic drug:  calcium carbonate Chew 2 tablets by  mouth 4 (four) times daily as needed for indigestion or heartburn.   vitamin B-12 1000 MCG tablet Commonly known as:  CYANOCOBALAMIN Take 2,000 mcg by mouth daily.   Vitamin D 50 MCG (2000 UT) tablet Take 2,000 Units by mouth daily.       Follow-up Information    Javier Glazier, MD. Schedule an appointment as soon as possible for a visit in 1 week(s).   Specialty:  Internal Medicine Contact information: [place] [address] [phone]           Major procedures and Radiology Reports - PLEASE review detailed and final reports thoroughly  -      Dg Chest Portable 1 View  Result Date: 05/13/2018 CLINICAL DATA:  Chest pain EXAM: PORTABLE CHEST 1 VIEW COMPARISON:  December 17, 2015 FINDINGS: There is patchy infiltrate in the left base. There is mild atelectatic change in the right base. Lungs elsewhere clear. Heart size and pulmonary vascularity are normal. No adenopathy. There is aortic atherosclerosis. No bone lesions. No pneumothorax. IMPRESSION: Patchy infiltrate consistent with pneumonia left base. Mild right base atelectasis. Heart size normal. There is aortic atherosclerosis. Aortic Atherosclerosis (ICD10-I70.0). Electronically Signed   By: Lowella Grip III M.D.   On: 05/13/2018 18:00    Micro Results    Recent Results (from the past 240 hour(s))  Culture, blood (routine x 2) Call MD if unable to obtain prior to antibiotics being given     Status: None (Preliminary result)   Collection Time: 05/13/18  8:06 PM  Result Value Ref Range Status   Specimen Description BLOOD RIGHT ANTECUBITAL  Final   Special Requests   Final    BOTTLES  DRAWN AEROBIC AND ANAEROBIC Blood Culture adequate volume   Culture   Final    NO GROWTH 3 DAYS Performed at [facility], [place] [address] [place], [place] [address]    Report Status PENDING  Incomplete  Culture, blood (routine x 2) Call MD if unable to obtain prior to antibiotics being given     Status: None  (Preliminary result)   Collection Time: 05/13/18  8:09 PM  Result Value Ref Range Status   Specimen Description BLOOD RIGHT ARM UPPER  Final   Special Requests   Final    BOTTLES DRAWN AEROBIC AND ANAEROBIC Blood Culture adequate volume   Culture   Final    NO GROWTH 3 DAYS Performed at [facility], [place] [address] [place], [place] [address]    Report Status PENDING  Incomplete  MRSA PCR Screening     Status: None   Collection Time: 05/14/18 10:43 AM  Result Value Ref Range Status   MRSA by PCR NEGATIVE NEGATIVE Final    Comment:        The GeneXpert MRSA Assay (FDA approved for NASAL specimens only), is one component of a comprehensive MRSA colonization surveillance program. It is not intended to diagnose MRSA infection nor to guide or monitor treatment for MRSA infections. Performed at [facility], [place] [address], [place], [place] [address]     Today   Subjective    Daril Warga today has no headache,no chest abdominal pain,no new weakness tingling or numbness.   Objective   Blood pressure (!) 161/81, pulse 99, temperature 98.7 F (37.1 C), temperature source Oral, resp. rate 14, height 5\' 8"  (1.727 m), weight 78.7 kg, SpO2 95 %.   Intake/Output Summary (Last 24 hours) at 05/16/2018 1013 Last data filed at 05/15/2018 2106 Gross per 24 hour  Intake 860 ml  Output -  Net 860 ml    Exam  Awake , mildly confused, No new F.N deficits, Normal affect .AT,PERRAL Supple Neck,No JVD, No cervical lymphadenopathy appriciated.  Symmetrical Chest wall movement, Good air movement bilaterally, CTAB RRR,No Gallops,Rubs or new Murmurs, No Parasternal Heave +ve B.Sounds, Abd Soft, Non tender, No organomegaly appriciated, No rebound -guarding or rigidity. No Cyanosis, Clubbing or edema, No new Rash or bruise   Data Review   CBC w Diff:  Lab Results  Component Value Date   WBC 8.9 05/15/2018   HGB 12.2 (L) 05/15/2018   HCT 36.0 (L) 05/15/2018   PLT 212  05/15/2018   LYMPHOPCT 4 05/13/2018   MONOPCT 11 05/13/2018   EOSPCT 0 05/13/2018   BASOPCT 0 05/13/2018    CMP:  Lab Results  Component Value Date   NA 133 (L) 05/15/2018   K 4.0 05/15/2018   CL 100 05/15/2018   CO2 22 05/15/2018   BUN 13 05/15/2018   CREATININE 0.98 05/15/2018   PROT 6.6 05/13/2018   ALBUMIN 3.6 05/13/2018   BILITOT 1.1 05/13/2018   ALKPHOS 40 05/13/2018   AST 20 05/13/2018   ALT 18 05/13/2018  .   Total Time in preparing paper work, data evaluation and todays exam - 79 minutes  Lala Lund M.D on 05/16/2018 at 10:13 AM  [facility]   Office  [phone]

## 2018-05-16 NOTE — Clinical Social Work Note (Signed)
Clinical Social Worker facilitated patient discharge including contacting patient family and facility to confirm patient discharge plans.  Clinical information faxed to facility and family agreeable with plan.  Pt's daughter will transport pt back to [facility] .  RN to call [phone] for report prior to discharge.  Clinical Social Worker will sign off for now as social work intervention is no longer needed. Please consult [place] again if new need arises.  [place], [place]

## 2018-05-16 NOTE — Discharge Instructions (Signed)
Follow with Primary MD Javier Glazier, MD in 7 days   Activity: As tolerated with Full fall precautions use walker/cane & assistance as needed  Disposition SNF with goal of care being comfort.  If declines full hospice.  Diet: Heart Healthy Low Carb , with feeding assistance and aspiration precautions. Check CBGs QAC-HS  Special Instructions: If you have smoked or chewed Tobacco  in the last 2 yrs please stop smoking, stop any regular Alcohol  and or any Recreational drug use.  On your next visit with your primary care physician please Get Medicines reviewed and adjusted.  Please request your Prim.MD to go over all Hospital Tests and Procedure/Radiological results at the follow up, please get all Hospital records sent to your Prim MD by signing hospital release before you go home.  If you experience worsening of your admission symptoms, develop shortness of breath, life threatening emergency, suicidal or homicidal thoughts you must seek medical attention immediately by calling 911 or calling your MD immediately  if symptoms less severe.  You Must read complete instructions/literature along with all the possible adverse reactions/side effects for all the Medicines you take and that have been prescribed to you. Take any new Medicines after you have completely understood and accpet all the possible adverse reactions/side effects.

## 2018-05-18 LAB — CULTURE, BLOOD (ROUTINE X 2)
Culture: NO GROWTH
Culture: NO GROWTH
Special Requests: ADEQUATE
Special Requests: ADEQUATE

## 2020-06-08 DEATH — deceased
